# Patient Record
Sex: Female | Born: 1970 | Race: White | Hispanic: No | Marital: Married | Smoking: Never smoker
Health system: Southern US, Community
[De-identification: ages and names within clinical notes are randomized; demographics above are authoritative.]

## PROBLEM LIST (undated history)

## (undated) DIAGNOSIS — G43909 Migraine, unspecified, not intractable, without status migrainosus: Secondary | ICD-10-CM

## (undated) DIAGNOSIS — D259 Leiomyoma of uterus, unspecified: Secondary | ICD-10-CM

## (undated) DIAGNOSIS — N390 Urinary tract infection, site not specified: Secondary | ICD-10-CM

## (undated) DIAGNOSIS — K219 Gastro-esophageal reflux disease without esophagitis: Secondary | ICD-10-CM

## (undated) DIAGNOSIS — Z8701 Personal history of pneumonia (recurrent): Secondary | ICD-10-CM

## (undated) DIAGNOSIS — T7840XA Allergy, unspecified, initial encounter: Secondary | ICD-10-CM

## (undated) HISTORY — DX: Migraine, unspecified, not intractable, without status migrainosus: G43.909

## (undated) HISTORY — DX: Allergy, unspecified, initial encounter: T78.40XA

## (undated) HISTORY — DX: Personal history of pneumonia (recurrent): Z87.01

## (undated) HISTORY — DX: Gastro-esophageal reflux disease without esophagitis: K21.9

## (undated) HISTORY — PX: UPPER GASTROINTESTINAL ENDOSCOPY: SHX188

## (undated) HISTORY — PX: GALLBLADDER SURGERY: SHX652

## (undated) HISTORY — PX: WISDOM TOOTH EXTRACTION: SHX21

## (undated) HISTORY — DX: Urinary tract infection, site not specified: N39.0

## (undated) HISTORY — DX: Leiomyoma of uterus, unspecified: D25.9

## (undated) HISTORY — PX: OTHER SURGICAL HISTORY: SHX169

---

## 2000-12-31 ENCOUNTER — Inpatient Hospital Stay (HOSPITAL_COMMUNITY): Admission: AD | Admit: 2000-12-31 | Discharge: 2000-12-31 | Payer: Self-pay | Admitting: Obstetrics and Gynecology

## 2001-01-03 ENCOUNTER — Encounter: Payer: Self-pay | Admitting: Obstetrics and Gynecology

## 2001-01-03 ENCOUNTER — Ambulatory Visit (HOSPITAL_COMMUNITY): Admission: RE | Admit: 2001-01-03 | Discharge: 2001-01-03 | Payer: Self-pay | Admitting: Obstetrics and Gynecology

## 2001-01-04 ENCOUNTER — Observation Stay (HOSPITAL_COMMUNITY): Admission: AD | Admit: 2001-01-04 | Discharge: 2001-01-05 | Payer: Self-pay | Admitting: Obstetrics and Gynecology

## 2001-01-31 ENCOUNTER — Encounter: Payer: Self-pay | Admitting: Obstetrics and Gynecology

## 2001-01-31 ENCOUNTER — Ambulatory Visit (HOSPITAL_COMMUNITY): Admission: RE | Admit: 2001-01-31 | Discharge: 2001-01-31 | Payer: Self-pay | Admitting: Obstetrics and Gynecology

## 2001-04-13 ENCOUNTER — Encounter: Payer: Self-pay | Admitting: Obstetrics and Gynecology

## 2001-04-13 ENCOUNTER — Ambulatory Visit (HOSPITAL_COMMUNITY): Admission: RE | Admit: 2001-04-13 | Discharge: 2001-04-13 | Payer: Self-pay | Admitting: Obstetrics and Gynecology

## 2001-05-11 ENCOUNTER — Encounter: Payer: Self-pay | Admitting: Obstetrics and Gynecology

## 2001-05-11 ENCOUNTER — Ambulatory Visit (HOSPITAL_COMMUNITY): Admission: RE | Admit: 2001-05-11 | Discharge: 2001-05-11 | Payer: Self-pay | Admitting: Obstetrics and Gynecology

## 2001-07-07 ENCOUNTER — Inpatient Hospital Stay (HOSPITAL_COMMUNITY): Admission: AD | Admit: 2001-07-07 | Discharge: 2001-07-07 | Payer: Self-pay | Admitting: Obstetrics and Gynecology

## 2001-07-08 ENCOUNTER — Inpatient Hospital Stay (HOSPITAL_COMMUNITY): Admission: AD | Admit: 2001-07-08 | Discharge: 2001-07-10 | Payer: Self-pay | Admitting: Obstetrics and Gynecology

## 2001-07-13 ENCOUNTER — Inpatient Hospital Stay (HOSPITAL_COMMUNITY): Admission: AD | Admit: 2001-07-13 | Discharge: 2001-07-13 | Payer: Self-pay | Admitting: Obstetrics and Gynecology

## 2006-11-03 ENCOUNTER — Ambulatory Visit: Payer: Self-pay | Admitting: Family Medicine

## 2007-01-24 ENCOUNTER — Ambulatory Visit: Payer: Self-pay | Admitting: Internal Medicine

## 2007-01-24 DIAGNOSIS — R109 Unspecified abdominal pain: Secondary | ICD-10-CM | POA: Insufficient documentation

## 2009-10-06 ENCOUNTER — Telehealth: Payer: Self-pay | Admitting: Internal Medicine

## 2010-05-14 ENCOUNTER — Encounter: Admission: RE | Admit: 2010-05-14 | Discharge: 2010-05-14 | Payer: Self-pay | Admitting: Family Medicine

## 2010-05-29 ENCOUNTER — Ambulatory Visit: Payer: Self-pay | Admitting: Cardiology

## 2010-05-29 ENCOUNTER — Encounter: Payer: Self-pay | Admitting: Family Medicine

## 2010-06-05 ENCOUNTER — Ambulatory Visit: Payer: Self-pay | Admitting: Cardiology

## 2010-07-09 ENCOUNTER — Emergency Department (HOSPITAL_COMMUNITY): Admission: EM | Admit: 2010-07-09 | Discharge: 2010-07-09 | Payer: Self-pay | Admitting: Emergency Medicine

## 2010-08-11 ENCOUNTER — Encounter: Payer: Self-pay | Admitting: Family Medicine

## 2010-08-13 ENCOUNTER — Encounter: Payer: Self-pay | Admitting: Family Medicine

## 2010-08-14 ENCOUNTER — Encounter: Payer: Self-pay | Admitting: Family Medicine

## 2010-08-18 ENCOUNTER — Encounter: Payer: Self-pay | Admitting: Family Medicine

## 2010-09-01 ENCOUNTER — Ambulatory Visit (HOSPITAL_COMMUNITY)
Admission: RE | Admit: 2010-09-01 | Discharge: 2010-09-01 | Payer: Self-pay | Source: Home / Self Care | Attending: Gastroenterology | Admitting: Gastroenterology

## 2010-09-17 ENCOUNTER — Ambulatory Visit
Admission: RE | Admit: 2010-09-17 | Discharge: 2010-09-17 | Payer: Self-pay | Source: Home / Self Care | Attending: Family Medicine | Admitting: Family Medicine

## 2010-09-17 DIAGNOSIS — K219 Gastro-esophageal reflux disease without esophagitis: Secondary | ICD-10-CM | POA: Insufficient documentation

## 2010-09-17 DIAGNOSIS — G43909 Migraine, unspecified, not intractable, without status migrainosus: Secondary | ICD-10-CM | POA: Insufficient documentation

## 2010-09-17 DIAGNOSIS — R209 Unspecified disturbances of skin sensation: Secondary | ICD-10-CM | POA: Insufficient documentation

## 2010-09-26 ENCOUNTER — Encounter: Payer: Self-pay | Admitting: Gastroenterology

## 2010-09-28 ENCOUNTER — Other Ambulatory Visit: Payer: Self-pay | Admitting: Gastroenterology

## 2010-10-02 ENCOUNTER — Encounter
Admission: RE | Admit: 2010-10-02 | Discharge: 2010-10-02 | Payer: Self-pay | Source: Home / Self Care | Attending: Surgery | Admitting: Surgery

## 2010-10-06 ENCOUNTER — Other Ambulatory Visit: Payer: Self-pay | Admitting: Surgery

## 2010-10-06 NOTE — Progress Notes (Signed)
Summary: ? Stomach virus  Phone Note Call from Patient Call back at Home Phone [phone]   Caller: Spouse Call For: Kristen Salt MD Summary of Call: Spoke with patient's husband.  Patient has been nauseated, having diarrhea, body aches since yesterday.  No fever, no sore throat.  Taking Ondansetron for nausea.  He says she has not been eating but taking sips of Coco cola.  Advised him that she should probably drink clear fluids like ginger ale, 7Up, Sprite, or gatoraid.  Avoid dark soda's and fluids, spicy foods, greasy foods.  Advised to hydrate herself well even if she has no appetite, appetite will eventually come back as she starts to feel better.  Advised him of the [employer]. Try to get plenty of rest.  Advised that I would sent this note to the doctor but there is not much that can be done if she has a virus, it will have to run its course.   Initial call taken by: Linde Gillis CMA Duncan Dull),  October 06, 2009 12:11 PM  Follow-up for Phone Call        should just have appt if sig fever or abd pain, or not improving Follow-up by: Kristen Salt MD,  October 06, 2009 12:56 PM  Additional Follow-up for Phone Call Additional follow up Details #1::        pt will just go to urgent care if not better DeShannon Smith CMA [facility] Dull)  October 06, 2009 2:24 PM

## 2010-10-08 NOTE — Assessment & Plan Note (Signed)
Summary: BRAND NEW PT/TO EST/REFERRED BY DR BRACKBILL/CJR   Vital Signs:  Patient profile:   40 year old female Menstrual status:  irregular LMP:     08/07/2010 Height:      65.50 inches Weight:      139 pounds BMI:     22.86 Temp:     98.5 degrees F oral Pulse rate:   100 / minute Pulse rhythm:   regular Resp:     12 per minute BP sitting:   140 / 90  (left arm) Cuff size:   regular  Vitals Entered By: Sid Falcon LPN (September 17, 2010 1:53 PM)  History of Present Illness: Patient is seen to establish care. Past medical history reviewed. She has history of GERD, migraine headaches, and migratory paresthesias for which she has seen multiple specialists. She has seen neurologist in [place], gastroenterologist here in [place], and cardiologist locally. She apparently had multiple tests with MRI and some labs.  No old records for review at this time. Migraines are somewhat infrequent.  Recurrent right upper quadrant pain with reported normal ultrasound and HIDA scan relatively normal. Continues to see gastroenterologist. Consideration for cholecystectomy. No recent fever, vomiting, weight loss.  No alleviating facotrs.  Intermittent numbness and stinging sensation of the tongue. Not sure of B12 as mentioned previously. This symptom has been intermittent for several months  History of GERD. No dysphagia. Takes omeprazole regularly. Prior h pylori testing negative.  Family history significant for hyperlipidemia. Stroke in one grandparent.  Patient is married. One 51-year-old child. Nonsmoker. No alcohol use. Works in Primary school teacher.  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 Never Smoked Alcohol use-yes Regular exercise-no  Risk Factors: Exercise: no (09/17/2010)  Risk Factors: Smoking Status: never (09/17/2010)  Past Medical History: chicken pox migraines GERD  Past Surgical History: none PMH-FH-SH reviewed for relevance  Family History: Family History of Arthritis, maternal grandfather Aunt, colon ca Father, elevated cholesterol paternal grandfather, stroke Aunt, cereberal hemerage 14 aunt, diabetes Cousin, diabetes  Social History: 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 Never Smoked Alcohol use-yes Regular exercise-no Occupation:  employed Smoking Status:  never Does Patient Exercise:  no  Review of Systems  The patient denies anorexia, fever, weight loss, weight gain, vision loss, decreased hearing, hoarseness, chest pain, syncope, dyspnea on exertion, peripheral edema, prolonged cough, headaches, hemoptysis, melena, hematochezia, severe indigestion/heartburn, hematuria, incontinence, muscle weakness, suspicious skin lesions, transient blindness, difficulty walking, depression, unusual weight change, abnormal bleeding, enlarged lymph nodes, and breast masses.    Physical Exam  General:  Well-developed,well-nourished,in no acute distress; alert,appropriate and cooperative throughout examination Head:  normocephalic and atraumatic.   Eyes:  pupils equal, pupils round, and pupils reactive to light.   Ears:  External ear exam shows no significant lesions or deformities.  Otoscopic examination reveals clear canals, tympanic membranes are intact bilaterally without bulging, retraction, inflammation or discharge. Hearing is grossly normal bilaterally. Mouth:  Oral mucosa and oropharynx without lesions or exudates.  Teeth in good repair. Neck:  No  deformities, masses, or tenderness noted. Lungs:  Normal respiratory effort, chest expands symmetrically. Lungs are clear  to auscultation, no crackles or wheezes. Heart:  Normal rate and regular rhythm. S1 and S2 normal without gallop, murmur, click, rub or other extra sounds. Abdomen:  soft and non-tender.   Extremities:  no edema. Neurologic:  alert & oriented X3, cranial nerves II-XII intact, strength normal in all extremities, sensation intact to light touch, gait normal, and DTRs symmetrical and normal.   Skin:  no rashes and no suspicious lesions.   Cervical Nodes:  couple small anterior cervical nodes bilaterally no supraclavicular adenopathy Psych:  normally interactive, good eye contact, and slightly anxious.     Impression & Recommendations:  Problem # 1:  GERD (ICD-530.81)  Her updated medication list for this problem includes:    Omeprazole 40 Mg Cpdr (Omeprazole) ..... Once daily  Problem # 2:  ABDOMINAL PAIN (ICD-789.00) recurrent RUQ pain with extensive workup.  ?acalculous cholecystitis  Problem # 3:  PARESTHESIA (ICD-782.0) Migratory paresthesias extrremities and more recently tongue.  Check old records. check B12 if not assessed.  Problem # 4:  MIGRAINE HEADACHE (ICD-346.90)  Complete Medication List: 1)  Omeprazole 40 Mg Cpdr (Omeprazole) .... Once daily  Patient Instructions: 1)  obtain old records from Dr. Patty Sermons and from neurologist   Orders Added: 1)  New Patient Level III [99203]    Preventive Care Screening  Pap Smear:    Date:  09/06/2009    Results:  normal

## 2010-10-08 NOTE — Letter (Signed)
Summary: Records from [facility] 2006 - 2011  Records from [facility] 2006 - 2011   Imported By: Maryln Gottron 09/22/2010 14:01:04  _____________________________________________________________________  External Attachment:    Type:   Image     Comment:   External Document

## 2010-10-14 NOTE — Letter (Signed)
Summary: [facility]  [facility]   Imported By: Maryln Gottron 10/07/2010 10:41:05  _____________________________________________________________________  External Attachment:    Type:   Image     Comment:   External Document

## 2010-10-14 NOTE — Letter (Signed)
Summary: [facility]  [facility]   Imported By: Maryln Gottron 10/07/2010 10:42:21  _____________________________________________________________________  External Attachment:    Type:   Image     Comment:   External Document

## 2010-11-17 LAB — HEPATIC FUNCTION PANEL
ALT: 19 U/L (ref 0–35)
Indirect Bilirubin: 0.8 mg/dL (ref 0.3–0.9)
Total Protein: 7.2 g/dL (ref 6.0–8.3)

## 2010-11-17 LAB — CBC
HCT: 40.4 % (ref 36.0–46.0)
Hemoglobin: 13.8 g/dL (ref 12.0–15.0)
RBC: 4.22 MIL/uL (ref 3.87–5.11)
WBC: 6.5 10*3/uL (ref 4.0–10.5)

## 2010-11-17 LAB — URINALYSIS, ROUTINE W REFLEX MICROSCOPIC
Glucose, UA: NEGATIVE mg/dL
Protein, ur: NEGATIVE mg/dL
pH: 6 (ref 5.0–8.0)

## 2010-11-17 LAB — DIFFERENTIAL
Lymphocytes Relative: 29 % (ref 12–46)
Lymphs Abs: 1.9 10*3/uL (ref 0.7–4.0)
Monocytes Absolute: 0.4 10*3/uL (ref 0.1–1.0)
Monocytes Relative: 6 % (ref 3–12)
Neutro Abs: 4.2 10*3/uL (ref 1.7–7.7)
Neutrophils Relative %: 64 % (ref 43–77)

## 2010-11-17 LAB — BASIC METABOLIC PANEL
Calcium: 9.5 mg/dL (ref 8.4–10.5)
GFR calc Af Amer: >60 mL/min (ref >60)
GFR calc non Af Amer: >60 mL/min (ref >60)
Glucose, Bld: 99 mg/dL (ref 70–99)
Potassium: 3.6 mEq/L (ref 3.5–5.1)
Sodium: 141 mEq/L (ref 135–145)

## 2010-11-17 LAB — URINE MICROSCOPIC-ADD ON

## 2011-01-04 IMAGING — CT CT ABD-PELV W/ CM
1 of 3 series · 14 of 32 positions shown, 19 images · IV contrast (agent unspecified)
Comparison: None.

CLINICAL DATA: Right sided abdominal pain.  Nausea.  Hematuria.

CT ABDOMEN AND PELVIS WITH CONTRAST
TECHNIQUE: Multidetector CT imaging of the abdomen and pelvis was
performed following the standard protocol during bolus
administration of intravenous contrast.
Contrast: 100 ml Kmnipaque-2SS

[Series 2: rtn ap with st · axial · 0.70mm/px · z∈[-566,-171]mm · 14 of 89 slices shown, 19 images]
[im 5/89  soft-tissue]
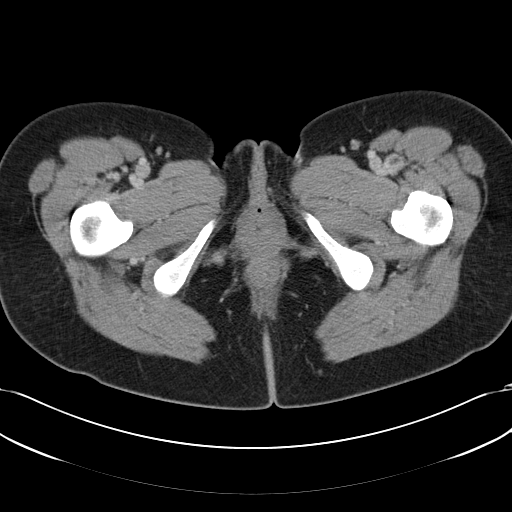
[im 5/89  bone]
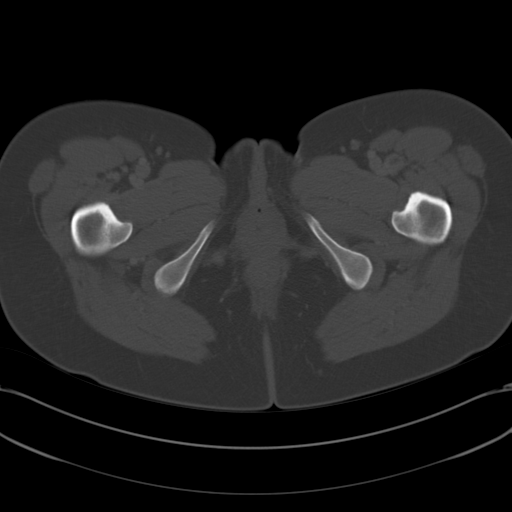
[im 14/89  soft-tissue]
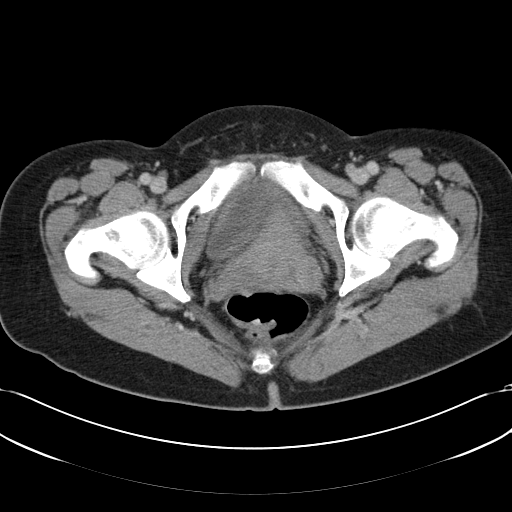
[im 18/89  soft-tissue]
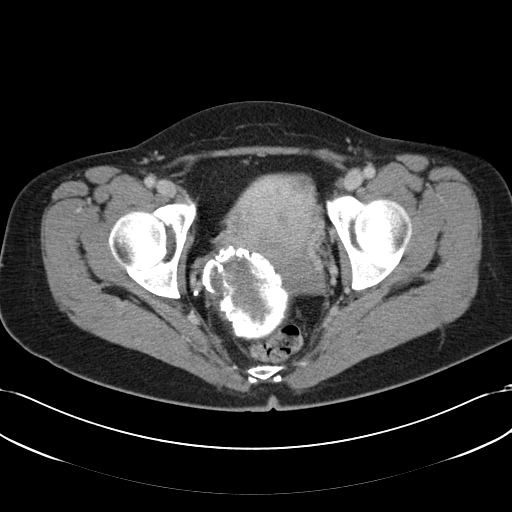
[im 27/89  soft-tissue]
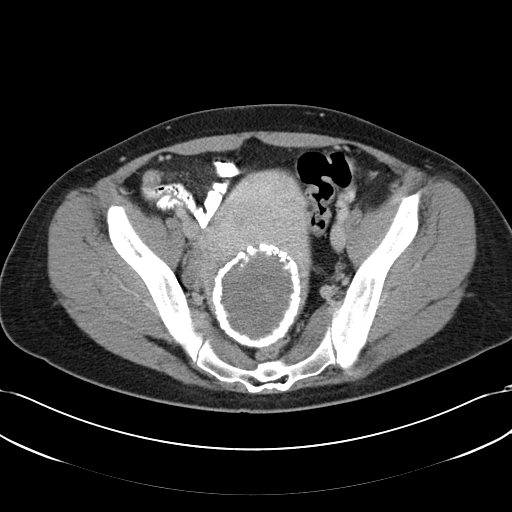
[im 31/89  soft-tissue]
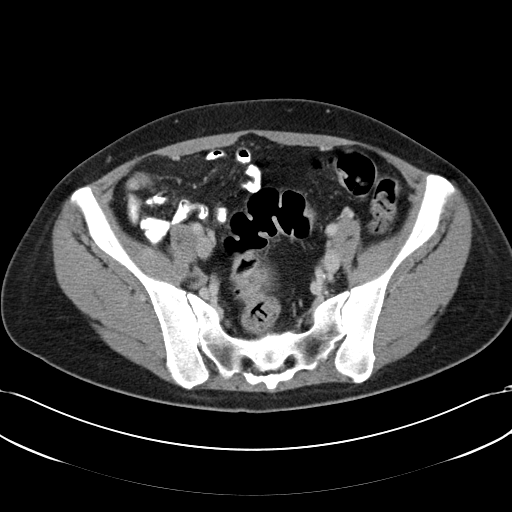
[im 40/89  soft-tissue]
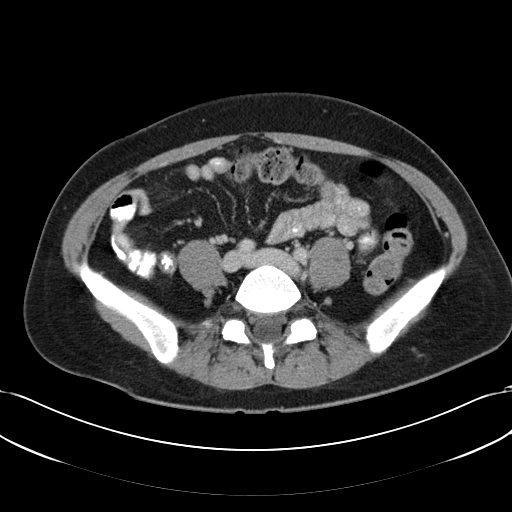
[im 45/89  soft-tissue]
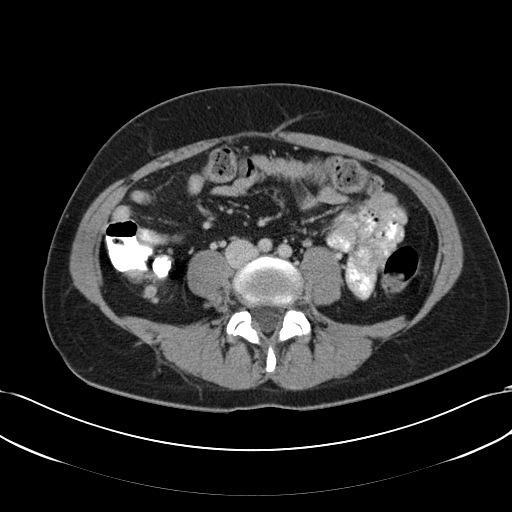
[im 49/89  soft-tissue]
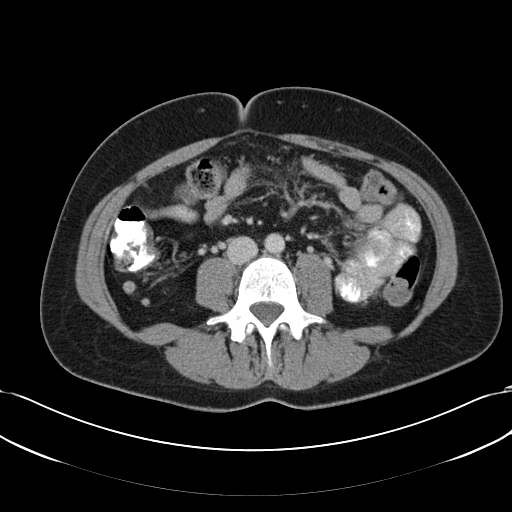
[im 58/89  soft-tissue]
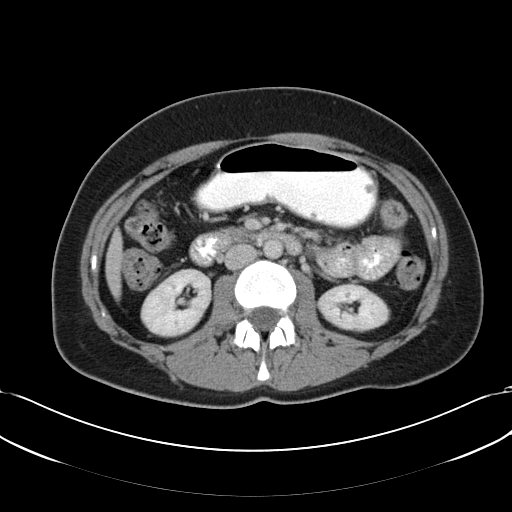
[im 58/89  bone]
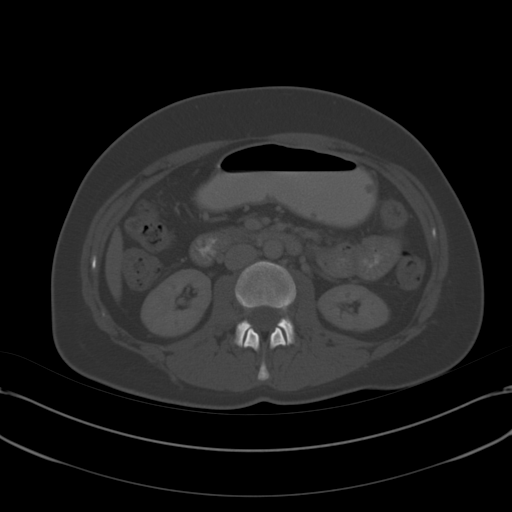
[im 62/89  soft-tissue]
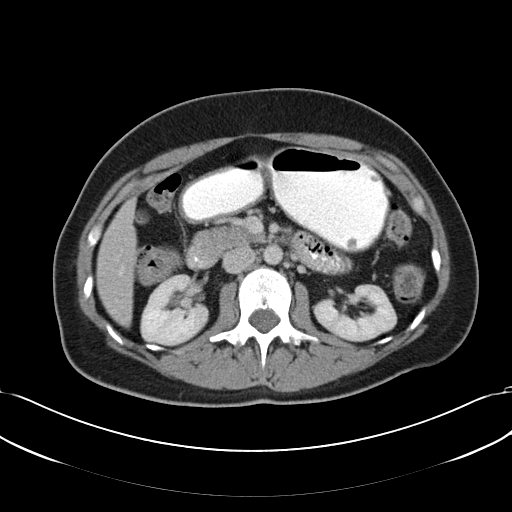
[im 71/89  soft-tissue]
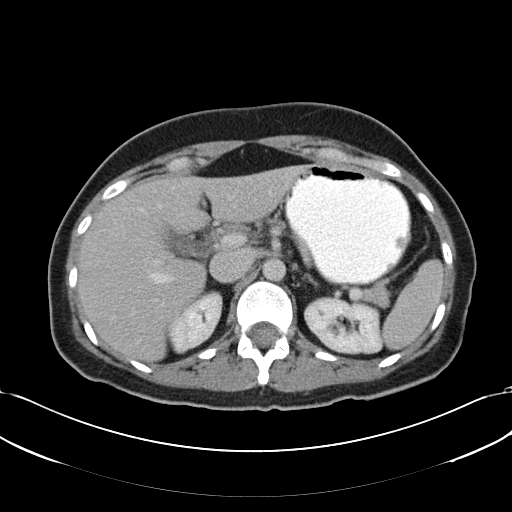
[im 71/89  lung]
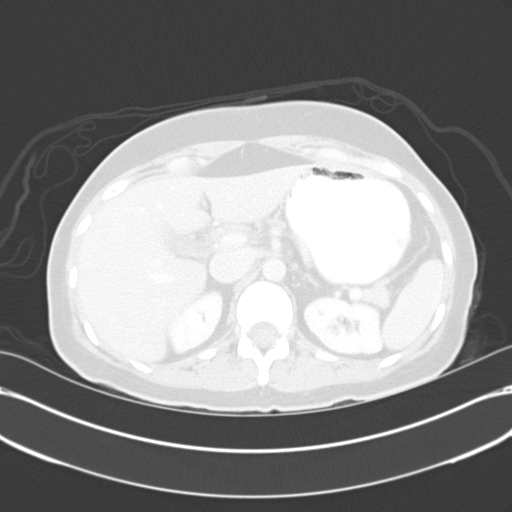
[im 75/89  soft-tissue]
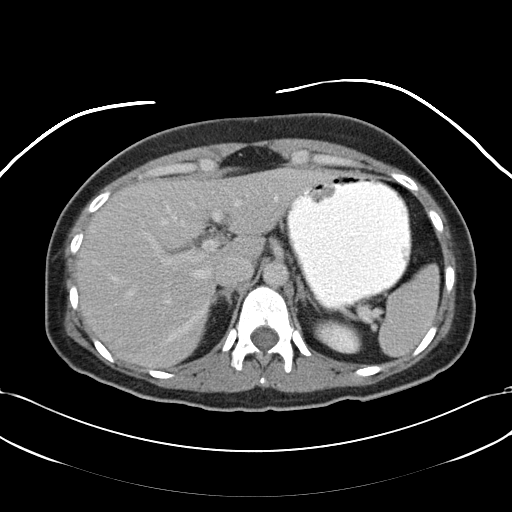
[im 75/89  lung]
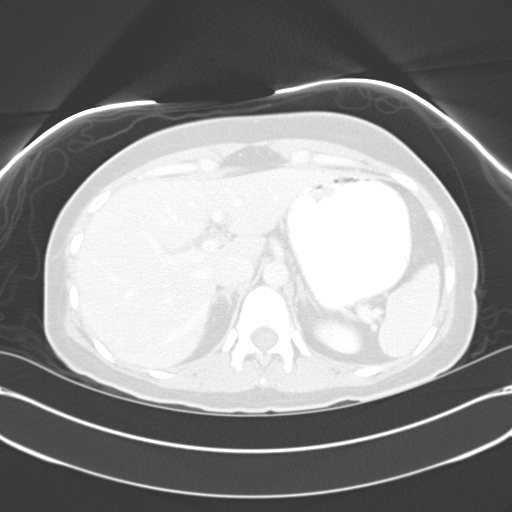
[im 80/89  lung]
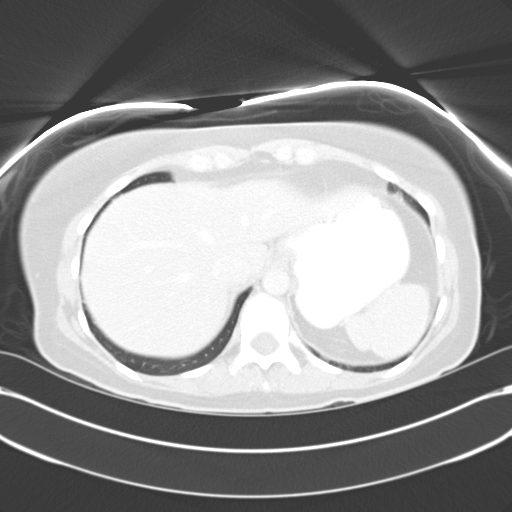
[im 84/89  soft-tissue]
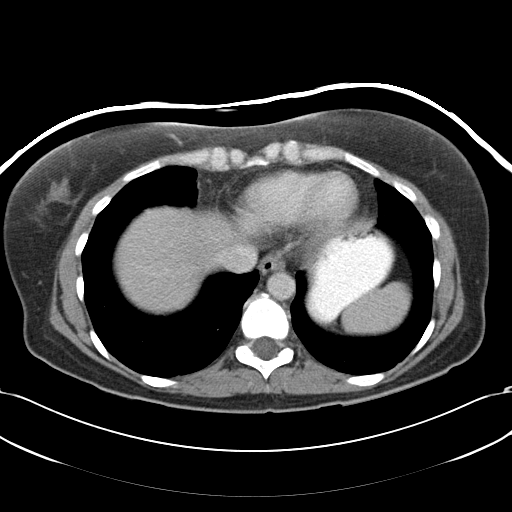
[im 84/89  lung]
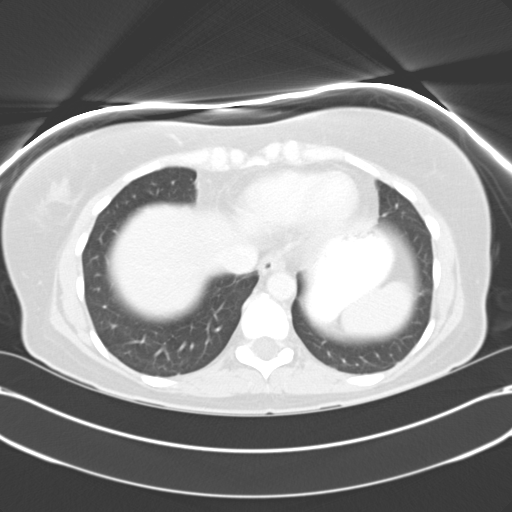

[14 of 32 positions shown; findings below may reference images not displayed]

FINDINGS: Lung bases show scattered subpleural atelectasis.  Heart
size normal.  No pericardial or pleural effusion.

Liver, gallbladder, adrenal glands, kidneys, spleen, pancreas
unremarkable.  There are numerous polypoid soft tissue lesions
along the dependent and nondependent walls of the gastric body,
with antral sparing.  Small bowel, appendix and colon are
unremarkable.

A rim calcified mass in the uterus measures 8.5 x 7.1 cm.  There
are smaller low attenuation lesions in the uterus as well.  No
pathologically enlarged lymph nodes.  No worrisome lytic or
sclerotic lesions.
IMPRESSION: 1.  No acute findings in the abdomen or pelvis.
2.  Suspect gastric polyps.  Upper endoscopy may be helpful in
further evaluation, as clinically indicated.
3.  Probable uterine fibroids, one of which is rather large.

## 2011-01-05 ENCOUNTER — Encounter: Payer: Self-pay | Admitting: Family Medicine

## 2011-01-22 NOTE — Discharge Summary (Signed)
[facility]  Patient:    RESA, RINKS Visit Number: [id number] MRN: [id number]          Service Type: GYN Location: [facility] Attending Physician:  Oliver Pila Dictated by:   Zenaida Niece, M.D. Admit Date:  07/13/2001 Discharge Date: 07/13/2001                             Discharge Summary  ADMISSION DIAGNOSES:        1. Intrauterine pregnancy at 40 weeks.                               2. Leiomyomatous uterus.  DISCHARGE DIAGNOSES:          1. Intrauterine pregnancy at 40 weeks.                               2. Leiomyomatous uterus.                               3. Postpartum urinary retention.  PROCEDURES:                   Spontaneous vaginal delivery.  COMPLICATIONS:                Postpartum urinary retention.  CONSULTATIONS:                None.  HISTORY AND PHYSICAL:         This is a 40 year old white female gravida 1, para 0 with an EGA of [redacted] weeks by a 10-week ultrasound with a due date of July 02, 2001, who presents with a complaint of ruptured membranes with clear fluid at approximately 0145 on July 08, 2001, with a few contractions, no bleeding and good fetal movement.  Prenatal care complicated by a possible second trimester kidney stone.  She was also admitted at 14 weeks for pain from a degenerating fibroid.  She has had gastroesophageal reflux requiring Prevacid due to the fact that Zantac did not provide any help.  She has had anemia treated with iron, as well.  PRENATAL LABS:                Blood type is A-positive with a negative antibody screen.  RPR nonreactive.  Rubella immune.  Hepatitis B surface antigen negative.  HIV negative.  Gonorrhea and chlamydia negative.  One-hour Glucola 139 and group B strep is negative.  GYNECOLOGICAL HISTORY:        No prior Pap smears or GYN exams.  PAST MEDICAL HISTORY:         Pneumonia at age 33 and a history of migraine headaches.  PAST SURGICAL HISTORY:         Negative.  ALLERGIES:                    CODEINE.  MEDICATIONS:                  Iron sulfate and Prevacid 15 mg q.d.  PHYSICAL EXAM:  GENERAL:                      She is afebrile with stable vital signs.  Fetal heart tracing reactive with irregular contractions, on Pitocin.  ABDOMEN:                      Gravid, nontender, with an estimated fetal weight of 7-1/2 pounds.  VAGINAL EXAM:                 In maternity admissions, reveals positive nitrazine, positive fern and she was 1/50/-2 with a vertex presentation.  HOSPITAL COURSE:              Patient was admitted with ruptured membranes with irregular contractions and started on Pitocin for augmentation.  She received an epidural once she entered active labor and continued to progress. On the evening of July 08, 2001, she did progress to complete and pushed well.  She brought the vertex to a +4 station and was exhausted.  She was given a perineal dose of her epidural and the risks of assisted deliveries were discussed.  Fetal heart tracing at that time had a normal baseline, but with moderate to severe variable decelerations with contractions.  The patient wished to avoid an assisted delivery.  With fundal pressure and pushing, the vertex to +5, a second-degree midline episiotomy was then made.  She then had an SVD of viable female infant with Apgars of 1 and 8 at one and five minutes that weighed 6 pounds 14 ounces.  The baby initially made no respiratory effort and bivalve masking was performed and the NICU team was called. The placenta delivered spontaneously and was intact and an arterial cord pH was 7.01.  She had a third-degree extension of her episiotomy and the external anal sphincter was repaired with 2-0 Vicryl and the remainder of her episiotomy and a right vaginal fornix and left perineal extensions were repaired with 2-0 Vicryl.  Estimated blood loss was 600 cc.  Postpartum, she did very well, remained afebrile.   She did have difficulty voiding and had a Foley catheter placed on postpartum day #1 due to inability to void. Predelivery hemoglobin 11.2, post-delivery is 9.0.  On the morning of postpartum day #2, she remained stable.  She continued to be afebrile, but had her Foley catheter still in at that time.  The Foley catheter was removed and later that day, she was still unable to void, so the Foley catheter was replaced.  It was connected to a leg bag and she was discharged home.  CONDITION ON DISCHARGE:       Stable.  DISPOSITION:                  Discharged to home.  DISCHARGE INSTRUCTIONS:  DIET:                         Regular diet.  ACTIVITY:                     Pro-progress.  FOLLOW-UP:                    In approximately three days to see how she can do with her Foley catheter removed.  MEDICATIONS:                  Percocet p.r.n. pain, over-the-counter Colace b.i.d. and Macrobid one p.o. q.d. while she has her Foley catheter in.  She was also given our discharge pamphlet. Dictated by:   Zenaida Niece, M.D. Attending Physician:  Oliver Pila DD:  07/30/01 TD:  07/30/01 Job: [id number]

## 2011-05-25 ENCOUNTER — Ambulatory Visit (INDEPENDENT_AMBULATORY_CARE_PROVIDER_SITE_OTHER): Payer: BC Managed Care – PPO | Admitting: Family Medicine

## 2011-05-25 DIAGNOSIS — R109 Unspecified abdominal pain: Secondary | ICD-10-CM

## 2011-05-25 DIAGNOSIS — M549 Dorsalgia, unspecified: Secondary | ICD-10-CM

## 2011-05-25 LAB — POCT URINALYSIS DIPSTICK
Bilirubin, UA: NEGATIVE
Blood, UA: NEGATIVE
Glucose, UA: NEGATIVE
Spec Grav, UA: 1.005
Urobilinogen, UA: 0.2

## 2011-06-21 ENCOUNTER — Encounter: Payer: Self-pay | Admitting: Family Medicine

## 2011-06-21 ENCOUNTER — Ambulatory Visit (INDEPENDENT_AMBULATORY_CARE_PROVIDER_SITE_OTHER): Payer: BC Managed Care – PPO | Admitting: Family Medicine

## 2011-06-21 VITALS — BP 112/78 | Temp 98.5°F | Wt 149.0 lb

## 2011-06-21 DIAGNOSIS — L309 Dermatitis, unspecified: Secondary | ICD-10-CM

## 2011-06-21 DIAGNOSIS — L259 Unspecified contact dermatitis, unspecified cause: Secondary | ICD-10-CM

## 2011-06-21 DIAGNOSIS — K029 Dental caries, unspecified: Secondary | ICD-10-CM

## 2011-06-21 MED ORDER — PENICILLIN V POTASSIUM 500 MG PO TABS: 500.0000 mg | ORAL_TABLET | Freq: Three times a day (TID) | ORAL | Status: AC

## 2011-06-21 MED ORDER — MOMETASONE FUROATE 0.1 % EX SOLN: Freq: Every day | CUTANEOUS | Status: AC | PRN

## 2011-06-21 NOTE — Patient Instructions (Signed)
Schedule follow up with dentist soon.

## 2011-06-21 NOTE — Progress Notes (Signed)
  Subjective:    Patient ID: Kristen Schwartz, female    DOB: 09-Nov-1970, 40 y.o.   MRN: [id number]  HPI Left ear pain. Pain is somewhat poorly localized. She has noticed this over the past few days. Similar episode back in July and was placed on antibiotic and eventually improved. She has wisdom tooth left lower gum which has caused her problems in the past. She denies any TMJ pain. No sore throat. No fever or chills.   Review of Systems  Constitutional: Negative for fever and chills.  HENT: Negative for sore throat.   Hematological: Positive for adenopathy.       Objective:   Physical Exam  Constitutional: She appears well-developed and well-nourished. No distress.  HENT:  Mouth/Throat: Oropharynx is clear and moist.       Eardrums appear normal  Neck: Neck supple.       Slightly tender left anterior cervical node. No posterior cervical adenopathy  Cardiovascular: Normal rate and regular rhythm.   Pulmonary/Chest: Effort normal and breath sounds normal. No respiratory distress. She has no wheezes. She has no rales.  Skin:       Eczematous rash both external canals          Assessment & Plan:  #1 dental decay left posterior wisdom tooth with possible early dental abscess. Penicillin V 500 mg 3 times a day for 10 days and schedule with oral surgeon soon #2 eczema ear canals. Elocon lotion once daily as needed

## 2011-06-25 ENCOUNTER — Telehealth: Payer: Self-pay | Admitting: *Deleted

## 2011-06-25 NOTE — Telephone Encounter (Signed)
Pt is complaining of neck and jaw pain, and is wondering if the Penicillin could be causing this.

## 2011-06-25 NOTE — Telephone Encounter (Signed)
No.  She has dental decay and this is very likely causing some of her symptoms. She needs to see dentist ASAP and we discussed this with pt.

## 2011-06-25 NOTE — Telephone Encounter (Signed)
Pt given Dr. Lucie Leather recommendations.

## 2011-07-09 NOTE — Progress Notes (Signed)
System Downtime Recovery The EMR experienced a system downtime.  This downtime occurred on 05-25-2011. During this downtime paper charting was completed by the provider.  The visit was documented on paper during the downtime and will be scanned into [facility], billing was completed by the [facility] Billing Department .  The visit is being closed on behalf of the provider. 

## 2011-09-14 ENCOUNTER — Telehealth: Payer: Self-pay | Admitting: *Deleted

## 2011-09-14 NOTE — Telephone Encounter (Signed)
Notified pt. 

## 2011-09-14 NOTE — Telephone Encounter (Signed)
Pt has a white, small lesion at the back of her throat with no symptoms.  There are people in her house that are sick with viral illnesses.  Wants to know if she needs to come in since she is having no pain?

## 2011-09-14 NOTE — Telephone Encounter (Signed)
Would observe if no sore throat and no fever.

## 2012-01-30 ENCOUNTER — Encounter: Payer: Self-pay | Admitting: *Deleted

## 2012-06-08 ENCOUNTER — Telehealth: Payer: Self-pay | Admitting: Family Medicine

## 2012-06-08 NOTE — Telephone Encounter (Signed)
[place] calling[place] cervical lymphadenopacthy. Was started on Keflex 500mg  tid x 7 days. She started the Keflex at lunch on Tuesday 10/1. Has swelling from her ear, face and neck on the left side.  Has slight pain.    She thought she should see some improvement and feel some better by today.   She is able to open and close her mouth normally.  Her face is sore.  No fever.  Denies any chest pain or shortness of breath.   [place] from the beach on [place]5.  She will continue with the Keflex.   Needs to be seen in the office on Monday, 10/7 and would like an afternoon appointment.  PLEASE CALL TO SCHEDULE APPT for 10/7.

## 2012-06-08 NOTE — Telephone Encounter (Signed)
Appt sched for 10/7 - pt aware.

## 2012-06-12 ENCOUNTER — Ambulatory Visit (INDEPENDENT_AMBULATORY_CARE_PROVIDER_SITE_OTHER): Payer: BC Managed Care – PPO | Admitting: Family Medicine

## 2012-06-12 ENCOUNTER — Encounter: Payer: Self-pay | Admitting: Family Medicine

## 2012-06-12 VITALS — BP 100/70 | Temp 98.3°F | Wt 159.0 lb

## 2012-06-12 DIAGNOSIS — B029 Zoster without complications: Secondary | ICD-10-CM

## 2012-06-12 NOTE — Progress Notes (Signed)
  Subjective:    Patient ID: Kristen Schwartz, female    DOB: 1971/06/01, 41 y.o.   MRN: [id number]  HPI  Less than 2 weeks ago patient had some left face "tingling". She noticed some lymphadenopathy left anterior cervical triangle region. Was seen at urgent care center and apparently placed on Keflex. Subsequently several days later broke out in a rash along with some tingling sensation trigeminal nerve distribution. She's had 3 separate clusters of vesicles left side of face which are now crusted. No fevers or chills. Lymphadenopathy is slowly improving. Minimal pain. No ear symptoms. No visual changes. She has had prior chickenpox   Review of Systems  Constitutional: Negative for fever and chills.  Skin: Positive for rash.  Neurological: Negative for headaches.  Hematological: Positive for adenopathy.       Objective:   Physical Exam  Constitutional: She appears well-developed and well-nourished.  Neck: Neck supple.       Minimal left anterior cervical triangle lymphadenopathy. Minimally tender.  Cardiovascular: Normal rate and regular rhythm.   Skin:       Patient has a few crusted lesions including left upper mandible region, left lower mandible region, and left lower lip region. No pustules          Assessment & Plan:  Resolving shingles left side of face. We explained her lymphadenopathy is very likely reactive and secondary to this. She is healing well with no signs of secondary infection. No indication for further antibiotics at this time. She is not requiring pain meds.

## 2012-06-12 NOTE — Patient Instructions (Addendum)

## 2012-07-20 ENCOUNTER — Encounter: Payer: Self-pay | Admitting: Cardiology

## 2012-10-27 ENCOUNTER — Encounter: Payer: Self-pay | Admitting: Cardiology

## 2012-11-27 ENCOUNTER — Telehealth: Payer: Self-pay | Admitting: Family Medicine

## 2012-11-27 NOTE — Telephone Encounter (Signed)
Attempted to return call to patient but call went to voice mail.  An identified voice mail left for caller.

## 2012-12-04 ENCOUNTER — Ambulatory Visit: Payer: BC Managed Care – PPO | Admitting: Family Medicine

## 2012-12-11 ENCOUNTER — Encounter: Payer: Self-pay | Admitting: Family Medicine

## 2012-12-11 ENCOUNTER — Ambulatory Visit (INDEPENDENT_AMBULATORY_CARE_PROVIDER_SITE_OTHER): Payer: BC Managed Care – PPO | Admitting: Family Medicine

## 2012-12-11 VITALS — BP 122/72 | Temp 98.6°F | Wt 162.0 lb

## 2012-12-11 DIAGNOSIS — H6120 Impacted cerumen, unspecified ear: Secondary | ICD-10-CM

## 2012-12-11 DIAGNOSIS — H6123 Impacted cerumen, bilateral: Secondary | ICD-10-CM

## 2012-12-11 DIAGNOSIS — R42 Dizziness and giddiness: Secondary | ICD-10-CM

## 2012-12-11 LAB — CBC WITH DIFFERENTIAL/PLATELET
Eosinophils Relative: 2.4 % (ref 0.0–5.0)
HCT: 38.4 % (ref 36.0–46.0)
Hemoglobin: 13.1 g/dL (ref 12.0–15.0)
Lymphs Abs: 2.3 10*3/uL (ref 0.7–4.0)
MCV: 94.2 fl (ref 78.0–100.0)
Monocytes Absolute: 0.4 10*3/uL (ref 0.1–1.0)
Monocytes Relative: 5.2 % (ref 3.0–12.0)
Neutro Abs: 4 10*3/uL (ref 1.4–7.7)
Platelets: 304 10*3/uL (ref 150.0–400.0)
RDW: 13.7 % (ref 11.5–14.6)
WBC: 6.9 10*3/uL (ref 4.5–10.5)

## 2012-12-11 LAB — BASIC METABOLIC PANEL
BUN: 14 mg/dL (ref 6–23)
Chloride: 103 mEq/L (ref 96–112)
GFR: 79.29 mL/min (ref >60.00)
Glucose, Bld: 100 mg/dL — ABNORMAL HIGH (ref 70–99)
Potassium: 4 mEq/L (ref 3.5–5.1)
Sodium: 139 mEq/L (ref 135–145)

## 2012-12-11 NOTE — Patient Instructions (Addendum)
Drink plenty of fluids Change positions slowly. Followup promptly if you have any syncope (passing out) or any worsening dizziness We will call you regarding lab work

## 2012-12-11 NOTE — Progress Notes (Signed)
  Subjective:    Patient ID: Kristen Schwartz, female    DOB: August 26, 1971, 42 y.o.   MRN: [id number]  HPI Feels "light headed" for several months.  Not always related to position change No syncope.  No consistent palpitations. Home BP 106/62 with one episode. No chest pain.  No vertigo.  No focal weakness. Patient denies any focal neurologic symptoms. She takes no regular medications.  Bilateral ear fullness. Prior history of cerumen impactions previously  Past Medical History  Diagnosis Date  . H/O: pneumonia   . Uterine fibroid   . Recurrent UTI    Past Surgical History  Procedure Laterality Date  . None      reports that she has never smoked. She does not have any smokeless tobacco history on file. She reports that she does not drink alcohol. Her drug history is not on file. family history includes Mitral valve prolapse in her mother. Allergies  Allergen Reactions  . Codeine Sulfate     REACTION: gi upset      Review of Systems  Constitutional: Negative for fever, chills and fatigue.  Respiratory: Negative for shortness of breath.   Cardiovascular: Negative for chest pain.  Skin: Negative for rash.  Neurological: Positive for dizziness. Negative for tremors, seizures, syncope, facial asymmetry, speech difficulty, weakness and headaches.  Psychiatric/Behavioral: Negative for confusion and sleep disturbance.       Objective:   Physical Exam  Constitutional: She is oriented to person, place, and time. She appears well-developed and well-nourished.  HENT:  Bilateral cerumen impactions  Eyes: Pupils are equal, round, and reactive to light.  Neck: Neck supple. No thyromegaly present.  Cardiovascular: Normal rate and regular rhythm.   Pulmonary/Chest: Effort normal and breath sounds normal. No respiratory distress. She has no wheezes. She has no rales.  Musculoskeletal: She exhibits no edema.  Neurological: She is alert and oriented to person, place, and time. No cranial  nerve deficit. Coordination normal.          Assessment & Plan:  #1 nonspecific dizziness. She describes this as lightheadedness. Nonfocal neurologic exam. She has blood pressure sitting 130/80 and standing 122/72. She does not appear anemic clinically. Check some basic labs. Increase hydration. Consider event monitor if symptoms persist or worsen #2 bilateral cerumen impactions. Irrigation

## 2012-12-13 NOTE — Progress Notes (Signed)
Quick Note:  Pt informed ______ 

## 2014-08-20 ENCOUNTER — Ambulatory Visit (INDEPENDENT_AMBULATORY_CARE_PROVIDER_SITE_OTHER): Payer: BC Managed Care – PPO | Admitting: Nurse Practitioner

## 2014-08-20 ENCOUNTER — Telehealth: Payer: Self-pay | Admitting: Family Medicine

## 2014-08-20 ENCOUNTER — Encounter: Payer: Self-pay | Admitting: Nurse Practitioner

## 2014-08-20 VITALS — BP 124/83 | HR 95 | Temp 98.4°F | Ht 66.0 in | Wt 161.0 lb

## 2014-08-20 DIAGNOSIS — B009 Herpesviral infection, unspecified: Secondary | ICD-10-CM

## 2014-08-20 MED ORDER — VALACYCLOVIR HCL 1 G PO TABS: 2000.0000 mg | ORAL_TABLET | Freq: Two times a day (BID) | ORAL | Status: DC

## 2014-08-20 NOTE — Patient Instructions (Signed)
I think you are having a herpes simplex outbreak.  It will flare with stress & sun exposure.  Please start valtrex as directed.  Salt water gargles twice daily (1/4 tsp salt mixed w/1/4 cup warm water) until sore resolved.  Nice to see you!  Herpes Simplex Herpes simplex is generally classified as Type 1 or Type 2. Type 1 is generally the type that is responsible for cold sores. Type 2 is generally associated with sexually transmitted diseases. We now know that most of the thoughts on these viruses are inaccurate. We find that HSV1 is also present genitally and HSV2 can be present orally, but this will vary in different locations of the world. Herpes simplex is usually detected by doing a culture. Blood tests are also available for this virus; however, the accuracy is often not as good.  PREPARATION FOR TEST No preparation or fasting is necessary. NORMAL FINDINGS  No virus present  No HSV antigens or antibodies present Ranges for normal findings may vary among different laboratories and hospitals. You should always check with your doctor after having lab work or other tests done to discuss the meaning of your test results and whether your values are considered within normal limits. MEANING OF TEST  Your caregiver will go over the test results with you and discuss the importance and meaning of your results, as well as treatment options and the need for additional tests if necessary. OBTAINING THE TEST RESULTS  It is your responsibility to obtain your test results. Ask the lab or department performing the test when and how you will get your results. Document Released: 09/25/2004 Document Revised: 11/15/2011 Document Reviewed: 08/03/2008 [facility] Patient Information 2015 [place], [place]. This information is not intended to replace advice given to you by your health care provider. Make sure you discuss any questions you have with your health care provider.

## 2014-08-20 NOTE — Progress Notes (Signed)
Subjective:     Kristen Schwartz is a 43 y.o. female presents w/c/o sore on tongue since yesterday, swollen cervical nodes for 3 days, mild HA, scratchy throat, low grade fever last night. Pt states she has had self-limiting episodes of LAD for 4 to 5 years. She states she has had w/u in past w/her PCP. It isn't typical to have sores in mouth, but occasionally gets "fever blisters on lips. She had shingles on L side of face about 2 yrs ago. She denies cough, nasal congestion, ear pain.   The following portions of the patient's history were reviewed and updated as appropriate: allergies, current medications, past medical history, past social history, past surgical history and problem list.  Review of Systems Constitutional: positive for fevers, negative for fatigue Ears, nose, mouth, throat, and face: positive for earaches and scratchy throat, negative for nasal congestion Respiratory: negative for cough Gastrointestinal: negative for diarrhea, nausea and anorexia Integument/breast: negative for rash Neurological: positive for headaches, negative for dizziness   Mouth: sore on L side of tongue Objective:    BP 124/83 mmHg  Pulse 95  Temp(Src) 98.4 F (36.9 C) (Temporal)  Ht 5\' 6"  (1.676 m)  Wt 161 lb (73.029 kg)  BMI 26.00 kg/m2  SpO2 99% BP 124/83 mmHg  Pulse 95  Temp(Src) 98.4 F (36.9 C) (Temporal)  Ht 5\' 6"  (1.676 m)  Wt 161 lb (73.029 kg)  BMI 26.00 kg/m2  SpO2 99% General appearance: alert, cooperative, appears stated age and no distress Head: Normocephalic, without obvious abnormality, atraumatic Eyes: negative findings: lids and lashes normal and conjunctivae and sclerae normal Ears: dry cerumen in both canals. unable to visualize TM Throat: normal findings: lips normal without lesions and soft palate, uvula, and tonsils normal and abnormal findings: aphthous ulceration Lungs: clear to auscultation bilaterally Heart: regular rate and rhythm, S1, S2 normal, no murmur, click,  rub or gallop Skin: Skin color, texture, turgor normal. No rashes or lesions Lymph nodes: bilat enlarged tonsillar nodes L>R, no cervical or [place] LAD    Assessment:Plan   1. HSV infection DD: hand foot mouth - valACYclovir (VALTREX) 1000 MG tablet; Take 2 tablets (2,000 mg total) by mouth every 12 (twelve) hours.  Dispense: 4 tablet; Refill: 1  See patient instructions for complete plan. F/u PRN

## 2014-08-20 NOTE — Progress Notes (Signed)
Pre visit review using our clinic review tool, if applicable. No additional management support is needed unless otherwise documented below in the visit note. 

## 2014-08-20 NOTE — Telephone Encounter (Signed)
[facility] - Client [place] Patient Name: Kristen Schwartz Gender: Female DOB: 01-18-1971 Age: 43 Y 10 M 28 D Return Phone Number: [phone] (Primary) Address: [place]/State/Zip: [place] [address] Client [facility] - Client Client Site [facility] - Day Physician Carolann Littler Contact Type Call Call Type Triage / Clinical Relationship To Patient Self Return Phone Number [phone] (Primary) Chief Complaint Lymph Node Swelling Initial Comment Caller states fever all night, side of neck swollen; hoarseness, on the same side had shingles (not now); has blisters under tongue; using salt water; PreDisposition Home Care Nurse Assessment Nurse: Vallery Sa, RN, Tye [place] Date/Time (Eastern Time): 08/20/2014 9:23:45 AM Confirm and document reason for call. If symptomatic, describe symptoms. ---Caller states she developed an enlarged left lymph node about 2 days ago, fever last night (about 100.0 last night) and blisters in her mouth yesterday. Has the patient traveled out of the country within the last 30 days? ---No Does the patient require triage? ---Yes Related visit to physician within the last 2 weeks? ---No Does the PT have any chronic conditions? (i.e. diabetes, asthma, etc.) ---Yes List chronic conditions. ---Shingles in the past Did the patient indicate they were pregnant? ---No Guidelines Guideline Title Affirmed Question Affirmed Notes Nurse Date/Time (Eastern Time) Lymph Nodes Swollen [1] Single large node AND [2] size > 1 inch (2.5 cm) AND [3] fever Trumbull, RN, Tye [place] 08/20/2014 9:25:43 AM Mouth Ulcers Large lymph node (> 1 inch or 2.5 cm) under the jaw Trumbull, RN, Cathy 08/20/2014 9:27:40 AM Disp. Time Eilene Ghazi Time) Disposition Final User 08/20/2014 9:27:11 AM See Physician within 4 Hours (or PCP triage) Vallery Sa, RN,  Tye [place] 08/20/2014 9:28:46 AM See Physician within 24 Hours Yes Trumbull, RN, Tye [place] PLEASE NOTE: All timestamps contained within this report are represented as [place] Standard Time. CONFIDENTIALTY NOTICE: This fax transmission is intended only for the addressee. It contains information that is legally privileged, confidential or otherwise protected from use or disclosure. If you are not the intended recipient, you are strictly prohibited from reviewing, disclosing, copying using or disseminating any of this information or taking any action in reliance on or regarding this information. If you have received this fax in error, please notify us immediately by telephone so that we can arrange for its return to [place]. Phone: [phone], Toll-Free: [phone], Fax: [phone] Page: 2 of 2 Call Id: [id number] [place] Understands: Yes Disagree/Comply: [name] Understands: Yes Disagree/Comply: Comply Care Advice Given Per Guideline SEE PHYSICIAN WITHIN 4 HOURS (or PCP triage): * IF NO PCP TRIAGE: You need to be seen. Go to _______________ (ED/UCC or office if it will be open) within the next 3 or 4 hours. Go sooner if you become worse. FEVER MEDICINES: * For fever relief, take acetaminophen or ibuprofen. * Treat fevers above 101 F (38.3 C). CALL BACK IF: * You become worse. CARE ADVICE given per Lymph Nodes Swollen (Adult) guideline. SEE PHYSICIAN WITHIN 24 HOURS: * IF OFFICE WILL BE OPEN: You need to be examined within the next 24 hours. Call your doctor when the office opens, and make an appointment. MOM AND BENADRYL MIXTURE FOR PAIN: * Mix equal amounts of Milk of Magnesia and Benadryl Allergy Liquid. * Every 4-6 hours swish a teaspoon of this mixture in your mouth, and then spit out. PAIN MEDICINES: ACETAMINOPHEN (E.G., TYLENOL): CALL BACK IF: * You become worse. CARE ADVICE given per Mouth Ulcers (Adult) guideline. After Care Instructions  Given Call Event Type User Date / Time  Description Comments User: Berton Mount, RN Date/Time Eilene Ghazi Time): 08/20/2014 9:32:25 AM Transferred to [place] B[place] via the Appointment Line to check on appointment options. Referrals REFERRED TO PCP OFFICE

## 2015-05-05 ENCOUNTER — Ambulatory Visit (INDEPENDENT_AMBULATORY_CARE_PROVIDER_SITE_OTHER): Payer: BLUE CROSS/BLUE SHIELD | Admitting: Family Medicine

## 2015-05-05 ENCOUNTER — Ambulatory Visit: Payer: Self-pay | Admitting: Family Medicine

## 2015-05-05 ENCOUNTER — Encounter: Payer: Self-pay | Admitting: Family Medicine

## 2015-05-05 VITALS — BP 120/80 | HR 79 | Temp 98.0°F | Wt 157.0 lb

## 2015-05-05 DIAGNOSIS — H6123 Impacted cerumen, bilateral: Secondary | ICD-10-CM | POA: Diagnosis not present

## 2015-05-05 DIAGNOSIS — H60543 Acute eczematoid otitis externa, bilateral: Secondary | ICD-10-CM

## 2015-05-05 MED ORDER — MOMETASONE FUROATE 0.1 % EX SOLN: Freq: Every day | CUTANEOUS | Status: DC

## 2015-05-05 NOTE — Progress Notes (Signed)
Pre visit review using our clinic review tool, if applicable. No additional management support is needed unless otherwise documented below in the visit note. 

## 2015-05-05 NOTE — Progress Notes (Signed)
   Subjective:    Patient ID: Kristen Schwartz, female    DOB: 08-31-1971, 44 y.o.   MRN: [id number]  HPI Patient seen with bilateral ear fullness right greater than left. long history of cerumen impactions. Using hydrogen peroxide without success. Some mild loss of hearing. No dizziness. No drainage. He has some dry skin and irritation and eczematous rash in both external canals possibly exacerbated by hydroperoxide.  Past Medical History  Diagnosis Date  . H/O: pneumonia   . Uterine fibroid   . Recurrent UTI    Past Surgical History  Procedure Laterality Date  . None      reports that she has never smoked. She does not have any smokeless tobacco history on file. She reports that she does not drink alcohol. Her drug history is not on file. family history includes Mitral valve prolapse in her mother. Allergies  Allergen Reactions  . Codeine Sulfate     REACTION: gi upset      Review of Systems  Constitutional: Negative for fever and chills.  HENT: Negative for ear discharge and ear pain.   Neurological: Negative for dizziness.       Objective:   Physical Exam  Constitutional: She appears well-developed and well-nourished.  HENT:  Mouth/Throat: Oropharynx is clear and moist.  Cerumen impaction both ear canals. She has scaly rash in both external canals and very dry skin  Cardiovascular: Normal rate and regular rhythm.   Pulmonary/Chest: Effort normal and breath sounds normal.          Assessment & Plan:  #1 cerumen impaction both ear canals. Removed with irrigation #2 dry eczematous rash ear canals. Probably exacerbated by hydroperoxide. Leave off hydroperoxide. Elocon lotion once daily as needed

## 2016-07-15 ENCOUNTER — Telehealth: Payer: Self-pay | Admitting: Family Medicine

## 2016-07-15 DIAGNOSIS — R8271 Bacteriuria: Secondary | ICD-10-CM | POA: Diagnosis not present

## 2016-07-15 DIAGNOSIS — N3000 Acute cystitis without hematuria: Secondary | ICD-10-CM | POA: Diagnosis not present

## 2016-07-15 DIAGNOSIS — R319 Hematuria, unspecified: Secondary | ICD-10-CM

## 2016-07-15 NOTE — Telephone Encounter (Signed)
LMTCB

## 2016-07-15 NOTE — Telephone Encounter (Signed)
Spoke with pt and she c/o back pain, hematuria with pain/burning. I offered appt with Tommi Rumps for today. Pt states that she "only wants to see a real doctor". She has self-referred herself to [facility] and they are asking for a referral in case her insurance needs one. She has been scheduled to see them at noon today as a new patient. She is asking for Dr Elease Hashimoto to complete a referral to [facility]. Advised pt that he is not in the office today, however I will send him a message and ask for the referral. She states that since he would send her to urology anyway she just wants to go ahead and be seen there.  Dr. Elease Hashimoto - Please advise as to urology referral. Thanks!

## 2016-07-15 NOTE — Telephone Encounter (Signed)
Patient Name: Kristen Schwartz  DOB: 02/21/1971    Initial Comment Caller having problems with kidneys, blood in urine, burning. Has been drinking cranberry juice,    Nurse Assessment  Nurse: Raphael Gibney, RN, Vanita Ingles Date/Time (Eastern Time): 07/15/2016 9:28:18 AM  Confirm and document reason for call. If symptomatic, describe symptoms. You must click the next button to save text entered. ---Caller states she has kidney problems. Has had a little blood in her urine. She has burning when she urinates. Has appt with the lab at noon. She is having back pain. She is drinking a lot of cranberry juice.  Has the patient traveled out of the country within the last 30 days? ---Not Applicable  Does the patient have any new or worsening symptoms? ---Yes  Will a triage be completed? ---Yes  Related visit to physician within the last 2 weeks? ---No  Does the PT have any chronic conditions? (i.e. diabetes, asthma, etc.) ---Yes  List chronic conditions. ---kidney problems  Is the patient pregnant or possibly pregnant? (Ask all females between the ages of 44-55) ---No  Is this a behavioral health or substance abuse call? ---No     Guidelines    Guideline Title Affirmed Question Affirmed Notes  Urination Pain - Female Side (flank) or lower back pain present    Final Disposition User   See Physician within 4 Hours (or PCP triage) Raphael Gibney, RN, Vera    Comments  pt states she is going to the lab at noon at [facility]. Please send order or referral.  please call pt back regarding order or referral to [facility].   Referrals  GO TO FACILITY OTHER - SPECIFY   Disagree/Comply: Comply

## 2016-07-16 NOTE — Telephone Encounter (Signed)
OK to refer.

## 2016-08-09 ENCOUNTER — Ambulatory Visit (INDEPENDENT_AMBULATORY_CARE_PROVIDER_SITE_OTHER): Payer: BLUE CROSS/BLUE SHIELD | Admitting: Family Medicine

## 2016-08-09 ENCOUNTER — Encounter: Payer: Self-pay | Admitting: Family Medicine

## 2016-08-09 VITALS — BP 110/86 | HR 82 | Temp 98.0°F | Wt 162.0 lb

## 2016-08-09 DIAGNOSIS — H60543 Acute eczematoid otitis externa, bilateral: Secondary | ICD-10-CM | POA: Diagnosis not present

## 2016-08-09 DIAGNOSIS — H6123 Impacted cerumen, bilateral: Secondary | ICD-10-CM | POA: Diagnosis not present

## 2016-08-09 NOTE — Addendum Note (Signed)
Addended by: Wyvonne Lenz on: 08/09/2016 03:27 PM   Modules accepted: Orders

## 2016-08-09 NOTE — Progress Notes (Signed)
Pre visit review using our clinic review tool, if applicable. No additional management support is needed unless otherwise documented below in the visit note. 

## 2016-08-09 NOTE — Patient Instructions (Signed)
Please use Elocon for eczema in ear canals. Ear wax softening drops can be used.    Earwax Buildup Your ears make a substance called earwax. It may also be called cerumen. Sometimes, too much earwax builds up in your ear canal. This can cause ear pain and make it harder for you to hear. CAUSES This condition is caused by too much earwax production or buildup. RISK FACTORS The following factors may make you more likely to develop this condition:  Cleaning your ears often with swabs.  Having narrow ear canals.  Having earwax that is overly thick or sticky.  Having eczema.  Being dehydrated. SYMPTOMS Symptoms of this condition include:  Reduced hearing.  Ear drainage.  Ear pain.  Ear itch.  A feeling of fullness in the ear or feeling that the ear is plugged.  Ringing in the ear.  Coughing. DIAGNOSIS Your health care provider can diagnose this condition based on your symptoms and medical history. Your health care provider will also do an ear exam to look inside your ear with a scope (otoscope). You may also have a hearing test. TREATMENT Treatment for this condition includes:  Over-the-counter or prescription ear drops to soften the earwax.  Earwax removal by a health care provider. This may be done:  By flushing the ear with body-temperature water.  With a medical instrument that has a loop at the end (earwax curette).  With a suction device. HOME CARE INSTRUCTIONS  Take over-the-counter and prescription medicines only as told by your health care provider.  Do not put any objects, including an ear swab, into your ear. You can clean the opening of your ear canal with a washcloth.  Drink enough water to keep your urine clear or pale yellow.  If you have frequent earwax buildup or you use hearing aids, consider seeing your health care provider every 6-12 months for routine preventive ear cleanings. Keep all follow-up visits as told by your health care  provider. SEEK MEDICAL CARE IF:  You have ear pain.  Your condition does not improve with treatment.  You have hearing loss.  You have blood, pus, or other fluid coming from your ear. This information is not intended to replace advice given to you by your health care provider. Make sure you discuss any questions you have with your health care provider. Document Released: 09/30/2004 Document Revised: 12/15/2015 Document Reviewed: 04/09/2015 Elsevier Interactive Patient Education  2017 [employer].

## 2016-08-09 NOTE — Progress Notes (Signed)
Subjective:    Patient ID: Kristen Schwartz, female    DOB: 1970/09/08, 45 y.o.   MRN: [id number]  HPI  Kristen Schwartz is a 45 year old who presents today ear fullness that has been present for 3 days. Recent history of "cold symptoms" that are resolving over the past week. She reports that ear fullness is greater in left ear than right ear. Associated symptom of mild loss of hearing. She denies fever, chills, sweats, dizziness, drainage,sinus pressure/pain, nasal congestion, sore throat, ear pain, cough, N/V/D, or myalgias. No aggravating or alleviating factors are noted. No treatments have been tried at home.   Review of Systems  Constitutional: Negative for chills, fatigue and fever.  HENT: Negative for congestion, postnasal drip, rhinorrhea, sinus pain, sinus pressure, sneezing and sore throat.   Respiratory: Negative for cough, shortness of breath and wheezing.   Cardiovascular: Negative for chest pain and palpitations.  Gastrointestinal: Negative for abdominal pain, diarrhea, nausea and vomiting.  Musculoskeletal: Negative for myalgias.  Neurological: Negative for dizziness, light-headedness and headaches.   Past Medical History:  Diagnosis Date  . H/O: pneumonia   . Recurrent UTI   . Uterine fibroid      Social History   Social History  . Marital status: Married    Spouse name: N/A  . Number of children: 0  . Years of education: N/A   Occupational History  . Not on file.   Social History Main Topics  . Smoking status: Never Smoker  . Smokeless tobacco: Not on file  . Alcohol use No  . Drug use: Unknown  . Sexual activity: Not on file   Other Topics Concern  . Not on file   Social History Narrative  . No narrative on file    Past Surgical History:  Procedure Laterality Date  . none      Family History  Problem Relation Age of Onset  . Mitral valve prolapse Mother     Allergies  Allergen Reactions  . Codeine Sulfate     REACTION: gi upset    Current  Outpatient Prescriptions on File Prior to Visit  Medication Sig Dispense Refill  . mometasone (ELOCON) 0.1 % lotion Apply topically daily. (Patient not taking: Reported on 08/09/2016) 60 mL 1   No current facility-administered medications on file prior to visit.     BP 110/86 (BP Location: Left Arm, Patient Position: Sitting, Cuff Size: Normal)   Pulse 82   Temp 98 F (36.7 C) (Oral)   Wt 162 lb (73.5 kg)   SpO2 98%   BMI 26.15 kg/m       Objective:   Physical Exam  Constitutional: She is oriented to person, place, and time. She appears well-developed and well-nourished.  HENT:  Right Ear: Tympanic membrane normal.  Left Ear: Tympanic membrane normal.  Dry scaly rash that appear to be extremely dry in external canals bilaterally  Eyes: Pupils are equal, round, and reactive to light. No scleral icterus.  Neck: Neck supple.  Cardiovascular: Normal rate and regular rhythm.   Pulmonary/Chest: Effort normal and breath sounds normal. She has no wheezes. She has no rales.  Lymphadenopathy:    She has no cervical adenopathy.  Neurological: She is alert and oriented to person, place, and time.  Skin: Skin is warm and dry. No rash noted.  Psychiatric: She has a normal mood and affect. Her behavior is normal. Judgment and thought content normal.       Assessment & Plan:  1. Bilateral impacted cerumen Irrigation of ears bilaterally completed. Able to visualize TMs. Advised use of ear wax softening drops on a preventive basis  2. Eczema of both external ears Advised patient to avoid use of hydrogen peroxide when cleaning ears. Elocon to use as needed for eczema noted.  Follow up if symptoms reoccur.   Delano Metz, FNP-C

## 2016-08-27 ENCOUNTER — Encounter: Payer: Self-pay | Admitting: Family Medicine

## 2016-08-27 ENCOUNTER — Telehealth: Payer: Self-pay | Admitting: Family Medicine

## 2016-08-27 ENCOUNTER — Ambulatory Visit (INDEPENDENT_AMBULATORY_CARE_PROVIDER_SITE_OTHER): Payer: BLUE CROSS/BLUE SHIELD | Admitting: Family Medicine

## 2016-08-27 ENCOUNTER — Ambulatory Visit: Payer: Self-pay

## 2016-08-27 VITALS — BP 106/75 | HR 93 | Temp 98.4°F | Resp 16 | Ht 66.0 in | Wt 159.8 lb

## 2016-08-27 DIAGNOSIS — K529 Noninfective gastroenteritis and colitis, unspecified: Secondary | ICD-10-CM

## 2016-08-27 MED ORDER — DIPHENOXYLATE-ATROPINE 2.5-0.025 MG PO TABS: ORAL_TABLET | ORAL | 0 refills | Status: DC

## 2016-08-27 NOTE — Telephone Encounter (Signed)
Patient Name: Kristen Schwartz  DOB: [birth year]    Initial Comment Hemma states she has been having diarrhea for three days.   Nurse Assessment  Nurse: Raphael Gibney, RN, Vanita Ingles Date/Time (Eastern Time): 08/27/2016 9:02:02 AM  Confirm and document reason for call. If symptomatic, describe symptoms. ---Caller states she has had diarrhea x 2 days. Last Friday, she had chills. Saturday and Sunday she was nauseated. Diarrhea started on Wednesday am and had 12 diarrhea stools. Had 5-6 stools yesterday and 3 very watery stools this am. No fever. No pain. She is eating jello and dry toast. No vomiting. She is drinking fluids. She is urinating.  Does the patient have any new or worsening symptoms? ---Yes  Will a triage be completed? ---Yes  Related visit to physician within the last 2 weeks? ---No  Does the PT have any chronic conditions? (i.e. diabetes, asthma, etc.) ---No  Is the patient pregnant or possibly pregnant? (Ask all females between the ages of 87-55) ---No  Is this a behavioral health or substance abuse call? ---No     Guidelines    Guideline Title Affirmed Question Affirmed Notes  Diarrhea [1] MODERATE diarrhea (e.g., 4-6 times / day more than normal) AND [2] present > 48 hours (2 days)    Final Disposition User   See Physician within 24 Hours [place], Therapist, sports, Vera    Comments  No appts available at [facility], or [facility]. Please call pt regarding appt.   Referrals  REFERRED TO PCP OFFICE  GO TO FACILITY REFUSED   Disagree/Comply: Comply

## 2016-08-27 NOTE — Telephone Encounter (Signed)
Please Advise Appointment scheduled for today at [facility]

## 2016-08-27 NOTE — Patient Instructions (Addendum)
Buy otc generic imodium and take as directed on the packaging.  Return if not improving by 08/31/16.

## 2016-08-27 NOTE — Progress Notes (Signed)
Pre visit review using our clinic review tool, if applicable. No additional management support is needed unless otherwise documented below in the visit note. 

## 2016-08-27 NOTE — Progress Notes (Signed)
OFFICE VISIT  08/27/2016   CC:  Chief Complaint  Patient presents with  . Diarrhea    x 3 days, taking pepto bismol   HPI:    Patient is a 45 y.o.  female who presents for diarrhea. Onset 2-3 d/a, watery BMs.  Worse in AM, seems to slack off in PM.  Number of stools per day about 5-10. No blood noted in stool.  Has had nausea x 1 week, no vomiting.  The nausea has eased up quite a bit last few days.  Trying to focus on bland food and clear liquids. No fevers.  Slight lightheadedness.  No antibiotics in the last month.  She had a UTI tx'd with abx but she thinks this is >4-6 wks ago.  No nocturnal diarrhea. No abd pain.  No malaise. No sick contacts recently.  No recent travel or camping.  No unusual foods eaten prior to onset.   Past Medical History:  Diagnosis Date  . H/O: pneumonia   . Recurrent UTI   . Uterine fibroid     Past Surgical History:  Procedure Laterality Date  . none     MEDS: none currently except pepto bismol  Allergies  Allergen Reactions  . Codeine Sulfate     REACTION: gi upset    ROS As per HPI  PE: Blood pressure 106/75, pulse 93, temperature 98.4 F (36.9 C), temperature source Oral, resp. rate 16, height 5\' 6"  (1.676 m), weight 159 lb 12 oz (72.5 kg), SpO2 99 %. Pt examined with Starla Link, CMA, as chaperone. Gen: Alert, well appearing.  Patient is oriented to person, place, time, and situation. [id number]: no injection, icteris, swelling, or exudate.  EOMI, PERRLA. Mouth: lips without lesion/swelling.  Oral mucosa pink and moist. Oropharynx without erythema, exudate, or swelling.  CV: RRR, no m/r/g.   LUNGS: CTA bilat, nonlabored resps, good aeration in all lung fields. ABD: soft, NT/ND, BS normal. EXT: no clubbing, cyanosis, or edema.   LABS:  none  IMPRESSION AND PLAN:  Gastroenteritis, suspect viral etiology. Will focus on trying to slow stooling down and decrease fluid loss. Try otc generic imodium first x 1 day. If not helpful,  then fill rx for lomotil that I printed and handed to pt. If not improving by 08/31/16, return for recheck---we'll get stool collection at that point.  An After Visit Summary was printed and given to the patient.  FOLLOW UP: Return if symptoms worsen or fail to improve.  Signed:  Crissie Sickles, MD           08/27/2016

## 2016-10-29 ENCOUNTER — Telehealth: Payer: Self-pay | Admitting: Family Medicine

## 2016-10-29 MED ORDER — OSELTAMIVIR PHOSPHATE 75 MG PO CAPS: 75.0000 mg | ORAL_CAPSULE | Freq: Every day | ORAL | 0 refills | Status: DC

## 2016-10-29 NOTE — Telephone Encounter (Signed)
OK 

## 2016-10-29 NOTE — Telephone Encounter (Signed)
Pt daughter was dx with flu on 2-17 and pt would like tami flu send into [facility] [address]

## 2016-10-29 NOTE — Telephone Encounter (Signed)
Okay? Last seen by you 05/05/2015 Jefm Miles, NP on 08/27/2016 acute visit

## 2016-10-29 NOTE — Telephone Encounter (Signed)
Medication sent in for patient. 

## 2017-03-07 DIAGNOSIS — H5340 Unspecified visual field defects: Secondary | ICD-10-CM | POA: Diagnosis not present

## 2017-03-07 DIAGNOSIS — Z83511 Family history of glaucoma: Secondary | ICD-10-CM | POA: Diagnosis not present

## 2017-03-07 DIAGNOSIS — H40013 Open angle with borderline findings, low risk, bilateral: Secondary | ICD-10-CM | POA: Diagnosis not present

## 2017-09-23 DIAGNOSIS — H40013 Open angle with borderline findings, low risk, bilateral: Secondary | ICD-10-CM | POA: Diagnosis not present

## 2018-02-01 ENCOUNTER — Ambulatory Visit (INDEPENDENT_AMBULATORY_CARE_PROVIDER_SITE_OTHER): Payer: BLUE CROSS/BLUE SHIELD | Admitting: Family Medicine

## 2018-02-01 ENCOUNTER — Encounter: Payer: Self-pay | Admitting: Family Medicine

## 2018-02-01 VITALS — BP 126/84 | HR 92 | Temp 98.2°F | Ht 66.0 in | Wt 167.2 lb

## 2018-02-01 DIAGNOSIS — R05 Cough: Secondary | ICD-10-CM | POA: Diagnosis not present

## 2018-02-01 DIAGNOSIS — H6123 Impacted cerumen, bilateral: Secondary | ICD-10-CM

## 2018-02-01 DIAGNOSIS — R053 Chronic cough: Secondary | ICD-10-CM

## 2018-02-01 DIAGNOSIS — H60543 Acute eczematoid otitis externa, bilateral: Secondary | ICD-10-CM

## 2018-02-01 MED ORDER — MOMETASONE FUROATE 0.1 % EX SOLN: Freq: Every day | CUTANEOUS | 2 refills | Status: DC

## 2018-02-01 NOTE — Progress Notes (Signed)
  Subjective:     Patient ID: Kristen Schwartz, female   DOB: 04-Apr-1971, 47 y.o.   MRN: [id number]  HPI Patient here for several things today as follows  She's had some fullness in both ears and has had history of cerumen build up in the past. She tried some Debrox on her own without much improvement. No ear pain. No drainage.  Second issue she states she had typical URI about 2 months ago. She's had some minimal dry persistent cough since then. No hemoptysis. No appetite or weight changes. No wheezing. Occasional GERD symptoms.Nasal drip symptoms. No ACE inhibitor use. No exacerbating factors.  Non-smoker.  She has history of recurrent scaling and itching of her external canals. Is requesting medication for that.  Past Medical History:  Diagnosis Date  . H/O: pneumonia   . Recurrent UTI   . Uterine fibroid    Past Surgical History:  Procedure Laterality Date  . none      reports that she has never smoked. She does not have any smokeless tobacco history on file. She reports that she does not drink alcohol or use drugs. family history includes Mitral valve prolapse in her mother. Allergies  Allergen Reactions  . Codeine Sulfate     REACTION: gi upset     Review of Systems  Constitutional: Negative for chills and fever.  HENT: Positive for hearing loss. Negative for congestion, ear discharge, ear pain, postnasal drip, sinus pressure and sinus pain.   Respiratory: Positive for cough. Negative for shortness of breath and wheezing.   Cardiovascular: Negative for chest pain.       Objective:   Physical Exam  Constitutional: She appears well-developed and well-nourished.  HENT:  She has cerumen impaction bilaterally  Neck: Neck supple.  Cardiovascular: Normal rate and regular rhythm.  Pulmonary/Chest: Effort normal and breath sounds normal. No respiratory distress. She has no wheezes. She has no rales.  Musculoskeletal: She exhibits no edema.  Skin: No rash noted.        Assessment:     #1 bilateral cerumen impaction  #2 persistent dry cough following what sounds like viral URI. Nonfocal exam. Question GERD component  #3 history of eczematous changes ear canals    Plan:     -Irrigation of both ear canals- with removal of cerumen. -Recommend consider trial of over-the-counter Pepcid 20 mg or Zantac 150 mg twice daily to help control reflux symptoms -Touch base if cough not resolving in 2-3 weeks -Elocon 0.1% lotion to use to ear canals once daily as needed for eczema flareups -Recommend she schedule complete physical  Eulas Post MD [facility] at [facility]

## 2018-02-01 NOTE — Patient Instructions (Signed)
Consider getting back on Pepcid 20 mg or Zantac 150 mg twice daily  Let me know if cough no better in 2-3 weeks.

## 2018-03-31 DIAGNOSIS — H40013 Open angle with borderline findings, low risk, bilateral: Secondary | ICD-10-CM | POA: Diagnosis not present

## 2018-11-06 ENCOUNTER — Ambulatory Visit: Payer: Self-pay

## 2018-11-06 NOTE — Telephone Encounter (Signed)
Incoming  Call from Patient with  A  Complaint of heart rate speeding  Up at  Least  3 times a year.   And 2 times  In the past 2 months.  Patient  Reports muscles spasms,  Cramping ,  Skins feels like  Sunburn.  Last  Episode was  Friday.  It  Usually      Occurs at  Night.  Denies SOB. Patient  Scheduled  For 11/07/18  Tuesday.  Patient  Is  To  Arrive  At  2:45pm Patient  Voices understanding.      Reason for Disposition . [1] Palpitations AND [2] no improvement after using CARE ADVICE  Answer Assessment - Initial Assessment Questions 1. DESCRIPTION: "Please describe your heart rate or heart beat that you are having" (e.g., fast/slow, regular/irregular, skipped or extra beats, "palpitations")     Start  shaking 2. ONSET: "When did it start?" (Minutes, hours or days)      After the  Birth of  My  daugter then  Years  Ago.   3. DURATION: "How long does it last" (e.g., seconds, minutes, hours)      Ten  To  15 minutes 4. PATTERN "Does it come and go, or has it been constant since it started?"  "Does it get worse with exertion?"   "Are you feeling it now?"     Yes.  Hits  All of  A  sudden 5. TAP: "Using your hand, can you tap out what you are feeling on a chair or table in front of you, so that I can hear?" (Note: not all patients can do this)       *No Answer* 6. HEART RATE: "Can you tell me your heart rate?" "How many beats in 15 seconds?"  (Note: not all patients can do this)       96  Normal  72 and  80 7. RECURRENT SYMPTOM: "Have you ever had this before?" If so, ask: "When was the last time?" and "What happened that time?"       yes 8. CAUSE: "What do you think is causing the palpitations?"     no 9. CARDIAC HISTORY: "Do you have any history of heart disease?" (e.g., heart attack, angina, bypass surgery, angioplasty, arrhythmia)     na 10. OTHER SYMPTOMS: "Do you have any other symptoms?" (e.g., dizziness, chest pain, sweating, difficulty breathing)       denies 11. PREGNANCY: "Is there  any chance you are pregnant?" "When was your last menstrual period?"       menopausal  Protocols used: HEART RATE AND HEARTBEAT QUESTIONS-A-AH

## 2018-11-07 ENCOUNTER — Ambulatory Visit (INDEPENDENT_AMBULATORY_CARE_PROVIDER_SITE_OTHER): Payer: BLUE CROSS/BLUE SHIELD | Admitting: Family Medicine

## 2018-11-07 ENCOUNTER — Encounter: Payer: Self-pay | Admitting: Family Medicine

## 2018-11-07 ENCOUNTER — Other Ambulatory Visit: Payer: Self-pay

## 2018-11-07 VITALS — BP 128/82 | HR 81 | Temp 98.2°F | Ht 65.1 in | Wt 165.0 lb

## 2018-11-07 DIAGNOSIS — R002 Palpitations: Secondary | ICD-10-CM

## 2018-11-07 NOTE — Patient Instructions (Signed)
Palpitations  Palpitations are feelings that your heartbeat is irregular or is faster than normal. It may feel like your heart is fluttering or skipping a beat. Palpitations are usually not a serious problem. They may be caused by many things, including smoking, caffeine, alcohol, stress, and certain medicines or drugs. Most causes of palpitations are not serious. However, some palpitations can be a sign of a serious problem. You may need further tests to rule out serious medical problems.  Follow these instructions at home:         Pay attention to any changes in your condition. Take these actions to help manage your symptoms:  Eating and drinking  Avoid foods and drinks that may cause palpitations. These may include:  Caffeinated coffee, tea, soft drinks, diet pills, and energy drinks.  Chocolate.  Alcohol.  Lifestyle  Take steps to reduce your stress and anxiety. Things that can help you relax include:  Yoga.  Mind-body activities, such as deep breathing, meditation, or using words and images to create positive thoughts (guided imagery).  Physical activity, such as swimming, jogging, or walking. Tell your health care provider if your palpitations increase with activity. If you have chest pain or shortness of breath with activity, do not continue the activity until you are seen by your health care provider.  Biofeedback. This is a method that helps you learn to use your mind to control things in your body, such as your heartbeat.  Do not use drugs, including cocaine or ecstasy. Do not use marijuana.  Get plenty of rest and sleep. Keep a regular bed time.  General instructions  Take over-the-counter and prescription medicines only as told by your health care provider.  Do not use any products that contain nicotine or tobacco, such as cigarettes and e-cigarettes. If you need help quitting, ask your health care provider.  Keep all follow-up visits as told by your health care provider. This is important. These may  include visits for further testing if palpitations do not go away or get worse.  Contact a health care provider if you:  Continue to have a fast or irregular heartbeat after 24 hours.  Notice that your palpitations occur more often.  Get help right away if you:  Have chest pain or shortness of breath.  Have a severe headache.  Feel dizzy or you faint.  Summary  Palpitations are feelings that your heartbeat is irregular or is faster than normal. It may feel like your heart is fluttering or skipping a beat.  Palpitations may be caused by many things, including smoking, caffeine, alcohol, stress, certain medicines, and drugs.  Although most causes of palpitations are not serious, some causes can be a sign of a serious medical problem.  Get help right away if you faint or have chest pain, shortness of breath, a severe headache, or dizziness.  This information is not intended to replace advice given to you by your health care provider. Make sure you discuss any questions you have with your health care provider.  Document Released: 08/20/2000 Document Revised: 10/05/2017 Document Reviewed: 10/05/2017  Elsevier Interactive Patient Education  2019 [employer]vier Inc[employer]

## 2018-11-07 NOTE — Progress Notes (Signed)
  Subjective:     Patient ID: Kristen Schwartz, female   DOB: [birth year], 48 y.o.   MRN: [id number]  HPI Patient is seen with periodic palpitations.  She states she has had about 3 episodes in the past year, most recently this past Friday .  With each occasion she remembers feeling some stress during episodes.  On 2 of 3 occasions she just learned about the death of someone in the family.  No history of panic disorder.  She states she has had the sensation of increased heart rate.  She checked on one occasion her heart rate was 96.  No associated fevers or chills.  She states that during episodes sometimes she feels somewhat shaky.  No loss of consciousness.  No syncope.  No associated dyspnea or chest pain..  She did not recall any hyperventilation.  She takes no medications.  No decongestant use.  Rare caffeine use.  She is not aware of any recent appetite changes.  She has had some gradual weight gain recently.  No recent lab work.  Past Medical History:  Diagnosis Date  . H/O: pneumonia   . Recurrent UTI   . Uterine fibroid    Past Surgical History:  Procedure Laterality Date  . GALLBLADDER SURGERY    . none      reports that she has never smoked. She has never used smokeless tobacco. She reports that she does not drink alcohol or use drugs. family history includes Mitral valve prolapse in her mother. Allergies  Allergen Reactions  . Codeine Sulfate     REACTION: gi upset     Review of Systems  Constitutional: Negative for appetite change, chills, fatigue, fever and unexpected weight change.  Eyes: Negative for visual disturbance.  Respiratory: Negative for cough, chest tightness, shortness of breath and wheezing.   Cardiovascular: Positive for palpitations. Negative for chest pain and leg swelling.  Neurological: Negative for dizziness, seizures, syncope, weakness, light-headedness and headaches.       Objective:   Physical Exam Constitutional:      Appearance: She is  well-developed.  Eyes:     Pupils: Pupils are equal, round, and reactive to light.  Neck:     Musculoskeletal: Neck supple.     Thyroid: No thyromegaly.     Vascular: No JVD.  Cardiovascular:     Rate and Rhythm: Normal rate and regular rhythm.     Heart sounds: No murmur. No gallop.   Pulmonary:     Effort: Pulmonary effort is normal. No respiratory distress.     Breath sounds: Normal breath sounds. No wheezing or rales.  Musculoskeletal:     Right lower leg: No edema.     Left lower leg: No edema.  Neurological:     Mental Status: She is alert.        Assessment:     #1 history of infrequent tachypalpitations.  She describes 3 different episodes lasting about 15 minutes each over the past year or so..  No hx of panic disorder.  ?Anxiety related.      Plan:     -Check EKG along with labs including basic metabolic panel, magnesium level, TSH, CBC -EKG shows sinus rhythm with non-specific T wave changes. -minimize caffeine use. -discussed possible event monitor if episodes become more frequent (Only 3 in past year)  Eulas Post MD [facility] at [facility]

## 2018-11-08 ENCOUNTER — Encounter: Payer: Self-pay | Admitting: Family Medicine

## 2018-11-08 LAB — CBC WITH DIFFERENTIAL/PLATELET
Basophils Absolute: 0.1 10*3/uL (ref 0.0–0.1)
Basophils Relative: 0.7 % (ref 0.0–3.0)
Eosinophils Absolute: 0.3 10*3/uL (ref 0.0–0.7)
Eosinophils Relative: 3.6 % (ref 0.0–5.0)
HCT: 39.3 % (ref 36.0–46.0)
Hemoglobin: 13.2 g/dL (ref 12.0–15.0)
LYMPHS ABS: 2.6 10*3/uL (ref 0.7–4.0)
Lymphocytes Relative: 32.5 % (ref 12.0–46.0)
MCHC: 33.7 g/dL (ref 30.0–36.0)
MCV: 94.5 fl (ref 78.0–100.0)
MONOS PCT: 5.4 % (ref 3.0–12.0)
Monocytes Absolute: 0.4 10*3/uL (ref 0.1–1.0)
NEUTROS PCT: 57.8 % (ref 43.0–77.0)
Neutro Abs: 4.7 10*3/uL (ref 1.4–7.7)
Platelets: 330 10*3/uL (ref 150.0–400.0)
RBC: 4.16 Mil/uL (ref 3.87–5.11)
RDW: 13 % (ref 11.5–15.5)
WBC: 8 10*3/uL (ref 4.0–10.5)

## 2018-11-08 LAB — MAGNESIUM: Magnesium: 1.9 mg/dL (ref 1.5–2.5)

## 2018-11-08 LAB — BASIC METABOLIC PANEL
BUN: 16 mg/dL (ref 6–23)
CO2: 31 mEq/L (ref 19–32)
Calcium: 9.5 mg/dL (ref 8.4–10.5)
Chloride: 101 mEq/L (ref 96–112)
Creatinine, Ser: 1.03 mg/dL (ref 0.40–1.20)
GFR: 56.69 mL/min — ABNORMAL LOW (ref >60.00)
Glucose, Bld: 97 mg/dL (ref 70–99)
Potassium: 4.1 mEq/L (ref 3.5–5.1)
Sodium: 140 mEq/L (ref 135–145)

## 2018-11-08 LAB — TSH: TSH: 0.89 u[IU]/mL (ref 0.35–4.50)

## 2018-11-09 NOTE — Telephone Encounter (Signed)
Pt is not able to get into Mychart. She states she is still having the muscle spasm issue and would like Dr. Elease Hashimoto or nurse to call her to discuss the next step and well as the results. / please advise

## 2018-11-10 ENCOUNTER — Other Ambulatory Visit: Payer: Self-pay

## 2018-11-10 MED ORDER — CYCLOBENZAPRINE HCL 5 MG PO TABS: 5.0000 mg | ORAL_TABLET | Freq: Every day | ORAL | 0 refills | Status: DC

## 2018-11-24 ENCOUNTER — Telehealth: Payer: Self-pay | Admitting: Family Medicine

## 2018-11-24 NOTE — Telephone Encounter (Signed)
Called patient and have her scheduled on 11/28/18 at 8am.

## 2018-11-24 NOTE — Telephone Encounter (Signed)
I think at least some of this may be related to anxiety symptoms.  Consider follow up to discuss options.

## 2018-11-24 NOTE — Telephone Encounter (Signed)
Please see message.  Please advise. 

## 2018-11-24 NOTE — Telephone Encounter (Signed)
Copied from [place] [id number]. Topic: Quick Communication - See Telephone Encounter >> Nov 24, 2018 10:36 AM Rayann Heman wrote: CRM for notification. See Telephone encounter for: 11/24/18. Pt called and stated that she is feeling very shakes inside of her body. Pt states that it happens when she is being still. Pt states that when she wakes up in the morning her arms feel sore. Pt states that she feels like its her nerves. Pt would like a call back from the nurse to discuss referral . Please advise

## 2018-11-28 ENCOUNTER — Ambulatory Visit (INDEPENDENT_AMBULATORY_CARE_PROVIDER_SITE_OTHER): Payer: BLUE CROSS/BLUE SHIELD | Admitting: Family Medicine

## 2018-11-28 ENCOUNTER — Other Ambulatory Visit: Payer: Self-pay

## 2018-11-28 ENCOUNTER — Encounter: Payer: Self-pay | Admitting: Family Medicine

## 2018-11-28 VITALS — BP 136/84 | HR 82 | Temp 96.8°F | Ht 65.1 in | Wt 163.4 lb

## 2018-11-28 DIAGNOSIS — R251 Tremor, unspecified: Secondary | ICD-10-CM | POA: Diagnosis not present

## 2018-11-28 MED ORDER — METOPROLOL SUCCINATE ER 25 MG PO TB24: 25.0000 mg | ORAL_TABLET | Freq: Every day | ORAL | 3 refills | Status: DC

## 2018-11-28 NOTE — Patient Instructions (Signed)

## 2018-11-28 NOTE — Progress Notes (Signed)
Subjective:     Patient ID: Kristen Schwartz, female   DOB: Apr 17, 1971, 48 y.o.   MRN: [id number]  HPI Patient is here to assess somewhat of a "vibratory, shaking, trembling" feeling anterior chest predominantly and mostly when she lays down at night.  Refer to previous note for details from 11-07-18:  "Patient is seen with periodic palpitations.  She states she has had about 3 episodes in the past year, most recently this past Friday .  With each occasion she remembers feeling some stress during episodes.  On 2 of 3 occasions she just learned about the death of someone in the family.  No history of panic disorder.  She states she has had the sensation of increased heart rate.  She checked on one occasion her heart rate was 96.  No associated fevers or chills.  She states that during episodes sometimes she feels somewhat shaky.  No loss of consciousness.  No syncope.  No associated dyspnea or chest pain..  She did not recall any hyperventilation.  She takes no medications.  No decongestant use.  Rare caffeine use.  She is not aware of any recent appetite changes.  She has had some gradual weight gain recently.  No recent lab work"  She had lab work including thyroid functions that were normal.  She today has really not complained of palpitations such as vibratory sensation.  She states that her father had some type of tremor since his 46s or 30s and presumably this was essential tremor.  There is no family history of Parkinson's in her parents.  Patient denies any exacerbating or alleviating factors.  No focal weakness.  No numbness.  She has had some anxiety symptoms with recent passing away of uncle and has had poor sleep at times.  Denies any chest pain.  No exertional symptoms.  No dyspnea. Does not feel depressed.  No visual changes.  No paresthesias .    Past Medical History:  Diagnosis Date  . H/O: pneumonia   . Recurrent UTI   . Uterine fibroid    Past Surgical History:  Procedure Laterality  Date  . GALLBLADDER SURGERY    . none      reports that she has never smoked. She has never used smokeless tobacco. She reports that she does not drink alcohol or use drugs. family history includes Mitral valve prolapse in her mother. Allergies  Allergen Reactions  . Codeine Sulfate     REACTION: gi upset    Review of Systems  Constitutional: Negative for appetite change, chills and fever.  Respiratory: Negative for shortness of breath.   Cardiovascular: Negative for chest pain.  Neurological: Positive for tremors. Negative for dizziness, seizures, syncope, facial asymmetry, speech difficulty, weakness, light-headedness, numbness and headaches.  Hematological: Negative for adenopathy. Does not bruise/bleed easily.  Psychiatric/Behavioral: Negative for confusion.       Objective:   Physical Exam Constitutional:      Appearance: Normal appearance.  Neck:     Musculoskeletal: Neck supple.  Cardiovascular:     Pulses: Normal pulses.     Heart sounds: Normal heart sounds.  Pulmonary:     Effort: Pulmonary effort is normal.     Breath sounds: Normal breath sounds. No wheezing or rales.  Neurological:     General: No focal deficit present.     Mental Status: She is alert and oriented to person, place, and time.     Cranial Nerves: No cranial nerve deficit.     Motor:  No weakness.     Coordination: Coordination normal.     Gait: Gait normal.     Comments: Full strength throughout.  Normal sensory function throughout        Assessment:     Patient presents with vague symptoms of vibratory sensation mostly anterior chest.  She does not describe any clear palpitations.  She does not have any objective evidence for tremor on exam or any focal neurologic deficits.  She does not think these are anxiety related.  She had expressed concerns about MS but we did not see any obvious neurologic deficits at this point    Plan:     -We discussed possible use of low-dose beta-blocker  such as Toprol-XL 25 mg daily to see if this blunts her symptoms.  She is undecided at this point but will consider and prescription is written  -Consider neurology referral if symptoms persist or she has any new symptoms such as sensory deficits, weakness, or visible tremor  Eulas Post MD [facility]

## 2018-12-04 ENCOUNTER — Encounter: Payer: Self-pay | Admitting: Family Medicine

## 2018-12-05 ENCOUNTER — Other Ambulatory Visit: Payer: Self-pay

## 2018-12-05 MED ORDER — METOPROLOL SUCCINATE ER 25 MG PO TB24: 25.0000 mg | ORAL_TABLET | Freq: Every day | ORAL | 3 refills | Status: DC

## 2019-03-13 DIAGNOSIS — R8271 Bacteriuria: Secondary | ICD-10-CM | POA: Diagnosis not present

## 2019-03-13 DIAGNOSIS — N134 Hydroureter: Secondary | ICD-10-CM | POA: Diagnosis not present

## 2019-03-13 DIAGNOSIS — N201 Calculus of ureter: Secondary | ICD-10-CM | POA: Diagnosis not present

## 2019-03-13 DIAGNOSIS — R350 Frequency of micturition: Secondary | ICD-10-CM | POA: Diagnosis not present

## 2019-03-13 DIAGNOSIS — D259 Leiomyoma of uterus, unspecified: Secondary | ICD-10-CM | POA: Diagnosis not present

## 2019-03-13 DIAGNOSIS — R3915 Urgency of urination: Secondary | ICD-10-CM | POA: Diagnosis not present

## 2019-03-13 DIAGNOSIS — N1330 Unspecified hydronephrosis: Secondary | ICD-10-CM | POA: Diagnosis not present

## 2019-04-24 DIAGNOSIS — H40013 Open angle with borderline findings, low risk, bilateral: Secondary | ICD-10-CM | POA: Diagnosis not present

## 2019-06-11 ENCOUNTER — Other Ambulatory Visit: Payer: Self-pay | Admitting: Family Medicine

## 2019-06-11 MED ORDER — METOPROLOL SUCCINATE ER 25 MG PO TB24: 25.0000 mg | ORAL_TABLET | Freq: Every day | ORAL | 0 refills | Status: DC

## 2019-06-11 NOTE — Telephone Encounter (Signed)
Medication Refill - Medication:  metoprolol succinate (TOPROL-XL) 25 MG 24 hr tablet  Has the patient contacted their pharmacy? Yes advised to call office.   Preferred Pharmacy (with phone number or street name):  [id number] - [place], [place] [phone] (Phone) [phone] (Fax)   Agent: Please be advised that RX refills may take up to 3 business days. We ask that you follow-up with your pharmacy.

## 2019-07-20 ENCOUNTER — Other Ambulatory Visit: Payer: Self-pay | Admitting: Family Medicine

## 2019-09-10 ENCOUNTER — Other Ambulatory Visit: Payer: Self-pay | Admitting: Family Medicine

## 2019-10-08 ENCOUNTER — Other Ambulatory Visit: Payer: Self-pay | Admitting: Family Medicine

## 2019-10-22 ENCOUNTER — Ambulatory Visit (INDEPENDENT_AMBULATORY_CARE_PROVIDER_SITE_OTHER): Payer: BC Managed Care – PPO | Admitting: Family Medicine

## 2019-10-22 ENCOUNTER — Other Ambulatory Visit: Payer: Self-pay

## 2019-10-22 ENCOUNTER — Encounter: Payer: Self-pay | Admitting: Family Medicine

## 2019-10-22 VITALS — BP 118/78 | HR 97 | Temp 97.2°F | Ht 65.1 in | Wt 161.1 lb

## 2019-10-22 DIAGNOSIS — R002 Palpitations: Secondary | ICD-10-CM

## 2019-10-22 NOTE — Patient Instructions (Signed)

## 2019-10-22 NOTE — Progress Notes (Signed)
  Subjective:     Patient ID: Kristen Schwartz, female   DOB: 1971-06-12, 49 y.o.   MRN: [id number]  HPI   Kristen Schwartz is seen with some recent increased palpitations.  She had several bouts last year and she had EKGs which were unremarkable.  We had placed her on low-dose metoprolol and taking just half of a 25 mg tablet she felt like these were suppressed for several months until about a week ago.  She has had some episodes usually lasting about 5 minutes where she felt like her heart rate is up over 100.  These have generally been occurring about 3-4 times per week over the past week.  No history of panic disorder or panic type symptoms.  No caffeine use.  She has eaten some chocolate recently.  No alcohol use.  She went through menopause around age 68.  She has not had any recent flushing.  No diarrhea symptoms.  No sweats.  No abdominal pain.  She had thyroid functions, magnesium, and electrolytes back in March which were unremarkable.  She had echocardiogram way back in 2007 for similar symptoms and this was reviewed and no major structural heart problems  Past Medical History:  Diagnosis Date  . H/O: pneumonia   . Recurrent UTI   . Uterine fibroid    Past Surgical History:  Procedure Laterality Date  . GALLBLADDER SURGERY    . none      reports that she has never smoked. She has never used smokeless tobacco. She reports that she does not drink alcohol or use drugs. family history includes Mitral valve prolapse in her mother. Allergies  Allergen Reactions  . Codeine Sulfate     REACTION: gi upset    Review of Systems  Constitutional: Negative for appetite change, chills, fatigue, fever and unexpected weight change.  Eyes: Negative for visual disturbance.  Respiratory: Negative for cough, chest tightness, shortness of breath and wheezing.   Cardiovascular: Positive for palpitations. Negative for chest pain and leg swelling.  Gastrointestinal: Negative for abdominal pain and diarrhea.   Genitourinary: Negative for dysuria.  Neurological: Negative for dizziness, seizures, syncope, weakness, light-headedness and headaches.       Objective:   Physical Exam Vitals reviewed.  Constitutional:      Appearance: Normal appearance.  Cardiovascular:     Rate and Rhythm: Normal rate and regular rhythm.     Heart sounds: No murmur. No gallop.   Pulmonary:     Effort: Pulmonary effort is normal.     Breath sounds: Normal breath sounds.  Musculoskeletal:     Right lower leg: No edema.     Left lower leg: No edema.  Neurological:     Mental Status: She is alert.        Assessment:     Intermittent palpitations.  She has had these intermittently for years.  Had been doing well with low-dose metoprolol until recently.  Previous electrolyte testing unremarkable.  She does not have any red flag symptoms such as chest pains, syncope, dizziness    Plan:     -Continue to avoid caffeine including chocolate -Set up cardiac event monitor to further assess -Increase metoprolol XL to full 25 mg tablet.  Could further titrate to 50 mg if necessary  Eulas Post MD  Primary Care at [facility]

## 2019-10-31 ENCOUNTER — Ambulatory Visit (INDEPENDENT_AMBULATORY_CARE_PROVIDER_SITE_OTHER): Payer: BC Managed Care – PPO

## 2019-10-31 DIAGNOSIS — R002 Palpitations: Secondary | ICD-10-CM | POA: Diagnosis not present

## 2019-11-14 ENCOUNTER — Encounter: Payer: Self-pay | Admitting: Family Medicine

## 2019-11-21 ENCOUNTER — Encounter: Payer: Self-pay | Admitting: Family Medicine

## 2019-11-21 ENCOUNTER — Other Ambulatory Visit: Payer: Self-pay | Admitting: Family Medicine

## 2019-11-21 DIAGNOSIS — R002 Palpitations: Secondary | ICD-10-CM

## 2019-11-21 DIAGNOSIS — R251 Tremor, unspecified: Secondary | ICD-10-CM

## 2019-11-21 MED ORDER — METOPROLOL SUCCINATE ER 25 MG PO TB24: 25.0000 mg | ORAL_TABLET | Freq: Every day | ORAL | 2 refills | Status: DC

## 2019-11-22 NOTE — Telephone Encounter (Signed)
Spoke to pt and she advised that her sx are no better. I asked pt if she would like the referral for cardio. Pt stated that if this is what she is needed. Pt advised that if sx worsen or persist.

## 2019-11-22 NOTE — Telephone Encounter (Signed)
Please advise 

## 2019-11-23 NOTE — Telephone Encounter (Signed)
[place] for referral to Cardio?

## 2019-11-26 NOTE — Telephone Encounter (Signed)
Referral placed.

## 2019-11-29 ENCOUNTER — Encounter: Payer: Self-pay | Admitting: Family Medicine

## 2020-01-09 NOTE — Progress Notes (Signed)
Cardiology Office Note:    Date:  01/10/2020   ID:  Kristen Schwartz, DOB 05-Mar-1971, MRN [id number]  PCP:  Eulas Post, MD  Cardiologist:  No primary care provider on file.   Referring MD: Eulas Post, MD   Chief Complaint  Patient presents with  . Irregular Heart Beat    History of Present Illness:    Kristen Schwartz is a 49 y.o. female with a hx of palpitations here for cardiology consultation..  Greater than 18-year history of intermittent increase in heart rate and tremor that occur spontaneously.  Early on these episodes will last minutes to 1/2-hour.  Over the years these episodes have progressed to the last hours.  Her very first episode occurred shortly after she delivered her daughter.  More recently approximately 1 month after recovering from COVID-19 infection she began noticing numbness, arm heaviness, tingling in arms and legs, sweating, tremor, which caused her to have concerned that there was a heart problem.  She has been previously given metoprolol succinate 12.5 mg/day 3 years ago.  With the most recent symptoms she doubled the dose to 25 mg/day and this dramatically improved the uneasiness and sensation of tremor/shaking.  After increasing the dose of medication she wondered if she even needed to come into the office because she has felt so much better.  She is otherwise very active, never had syncope, denies orthopnea, PND, and lower extremity swelling.  No chest pain.  No pre-existing heart history.  Mother has mitral valve prolapse.  She does not smoke and is not diabetic.  Past Medical History:  Diagnosis Date  . H/O: pneumonia   . Recurrent UTI   . Uterine fibroid     Past Surgical History:  Procedure Laterality Date  . GALLBLADDER SURGERY    . none      Current Medications: Current Meds  Medication Sig  . Lactobacillus (PROBIOTIC ACIDOPHILUS PO) Take 1 tablet by mouth daily.  . metoprolol succinate (TOPROL-XL) 25 MG 24 hr tablet Take 1  tablet (25 mg total) by mouth daily.  . mometasone (ELOCON) 0.1 % lotion Apply topically daily.     Allergies:   Codeine sulfate   Social History   Socioeconomic History  . Marital status: Married    Spouse name: Not on file  . Number of children: 0  . Years of education: Not on file  . Highest education level: Not on file  Occupational History  . Not on file  Tobacco Use  . Smoking status: Never Smoker  . Smokeless tobacco: Never Used  Substance and Sexual Activity  . Alcohol use: No  . Drug use: Never  . Sexual activity: Not Currently  Other Topics Concern  . Not on file  Social History Narrative  . Not on file   Social Determinants of Health   Financial Resource Strain:   . Difficulty of Paying Living Expenses:   Food Insecurity:   . Worried About Charity fundraiser in the Last Year:   . Arboriculturist in the Last Year:   Transportation Needs:   . Film/video editor (Medical):   Marland Kitchen Lack of Transportation (Non-Medical):   Physical Activity:   . Days of Exercise per Week:   . Minutes of Exercise per Session:   Stress:   . Feeling of Stress :   Social Connections:   . Frequency of Communication with Friends and Family:   . Frequency of Social Gatherings with Friends and Family:   .  Attends Religious Services:   . Active Member of Clubs or Organizations:   . Attends Archivist Meetings:   Marland Kitchen Marital Status:      Family History: The patient's family history includes Mitral valve prolapse in her mother.  ROS:   Please see the history of present illness.    She is otherwise healthy.  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  She feels that she is completely over the COVID-19 infection.  All other systems reviewed and are negative.  EKGs/Labs/Other Studies Reviewed:    The following studies were reviewed today: No cardiac evaluation  EKG:  EKG sinus  rhythm, heart rate 82, overall normal EKG appearance.  Recent Labs: No results found for requested labs within last 8760 hours.  Recent Lipid Panel No results found for: CHOL, TRIG, HDL, CHOLHDL, VLDL, LDLCALC, LDLDIRECT  Physical Exam:    VS:  BP 128/76   Pulse 82   Ht 5' 5.1" (1.654 m)   Wt 162 lb 12.8 oz (73.8 kg)   SpO2 97%   BMI 27.01 kg/m     Wt Readings from Last 3 Encounters:  01/10/20 162 lb 12.8 oz (73.8 kg)  10/22/19 161 lb 1.6 oz (73.1 kg)  11/28/18 163 lb 6.4 oz (74.1 kg)     GEN: Denies complaints and appears healthy, slightly overweight.. No acute distress HEENT: Normal NECK: No JVD. LYMPHATICS: No lymphadenopathy CARDIAC:  RRR without murmur, gallop, or edema. VASCULAR:  Normal Pulses. No bruits. RESPIRATORY:  Clear to auscultation without rales, wheezing or rhonchi  ABDOMEN: Soft, non-tender, non-distended, No pulsatile mass, MUSCULOSKELETAL: No deformity  SKIN: Warm and dry NEUROLOGIC:  Alert and oriented x 3 PSYCHIATRIC:  Normal affect   ASSESSMENT:    1. Palpitations   2. Tremor   3. Anxiety    PLAN:    In order of problems listed above:  1. He has a sign/symptom complex of the above 3 symptoms which include increased heart rate, shaking/tremor, and possibly exacerbation by superimposed anxiety.  Beta-blocker therapy appears to be effective at reducing the awareness of the heartbeat and the tremor.  She is currently on 25 mg metoprolol succinate daily.  I do not believe there is a cardiac issue here.  Perhaps she at times has anxiety with a hyperadrenergic component that leads to tremor and recent heart rate.  At worst, if there is an organic issue it would be neurological.  I have recommended watchful waiting.  If she continues to have symptoms, especially if they include numbness and tingling in a substantial manner, a neurology consultation should be performed.   Medication Adjustments/Labs and Tests Ordered: Current medicines are reviewed at  length with the patient today.  Concerns regarding medicines are outlined above.  Orders Placed This Encounter  Procedures  . EKG 12-Lead   No orders of the defined types were placed in this encounter.   Patient Instructions  Medication Instructions:  Your physician recommends that you continue on your current medications as directed. Please refer to the Current Medication list given to you today.  *If you need a refill on your cardiac medications before your next appointment, please call your pharmacy*   Lab Work: None If you have labs (blood work) drawn today and your tests are completely normal, you will receive your results only by: Marland Kitchen MyChart Message (if you have MyChart) OR . A paper copy in the  mail If you have any lab test that is abnormal or we need to change your treatment, we will call you to review the results.   Testing/Procedures: None   Follow-Up: At [facility], you and your health needs are our priority.  As part of our continuing mission to provide you with exceptional heart care, we have created designated Provider Care Teams.  These Care Teams include your primary Cardiologist (physician) and Advanced Practice Providers (APPs -  Physician Assistants and Nurse Practitioners) who all work together to provide you with the care you need, when you need it.  We recommend signing up for the patient portal called "MyChart".  Sign up information is provided on this After Visit Summary.  MyChart is used to connect with patients for Virtual Visits (Telemedicine).  Patients are able to view lab/test results, encounter notes, upcoming appointments, etc.  Non-urgent messages can be sent to your provider as well.   To learn more about what you can do with MyChart, go to [email or web address].    Your next appointment:   As needed  The format for your next appointment:   In Person  Provider:   You may see Dr. Daneen Schick or one of the following Advanced Practice  Providers on your designated Care Team:    Truitt Merle, NP  Cecilie Kicks, NP  Kathyrn Drown, NP    Other Instructions      Signed, Sinclair Grooms, MD  01/10/2020 3:04 PM    [place]

## 2020-01-10 ENCOUNTER — Encounter: Payer: Self-pay | Admitting: Interventional Cardiology

## 2020-01-10 ENCOUNTER — Ambulatory Visit (INDEPENDENT_AMBULATORY_CARE_PROVIDER_SITE_OTHER): Payer: BC Managed Care – PPO | Admitting: Interventional Cardiology

## 2020-01-10 ENCOUNTER — Other Ambulatory Visit: Payer: Self-pay

## 2020-01-10 VITALS — BP 128/76 | HR 82 | Ht 65.1 in | Wt 162.8 lb

## 2020-01-10 DIAGNOSIS — R251 Tremor, unspecified: Secondary | ICD-10-CM

## 2020-01-10 DIAGNOSIS — R002 Palpitations: Secondary | ICD-10-CM

## 2020-01-10 DIAGNOSIS — F419 Anxiety disorder, unspecified: Secondary | ICD-10-CM

## 2020-01-10 NOTE — Patient Instructions (Signed)

## 2020-02-28 ENCOUNTER — Other Ambulatory Visit: Payer: Self-pay | Admitting: Family Medicine

## 2020-04-04 ENCOUNTER — Other Ambulatory Visit: Payer: Self-pay

## 2020-04-04 ENCOUNTER — Encounter: Payer: Self-pay | Admitting: Family Medicine

## 2020-04-04 ENCOUNTER — Ambulatory Visit (INDEPENDENT_AMBULATORY_CARE_PROVIDER_SITE_OTHER): Payer: BC Managed Care – PPO | Admitting: Family Medicine

## 2020-04-04 VITALS — BP 124/72 | HR 86 | Temp 98.6°F | Ht 65.1 in | Wt 161.3 lb

## 2020-04-04 DIAGNOSIS — J029 Acute pharyngitis, unspecified: Secondary | ICD-10-CM

## 2020-04-04 DIAGNOSIS — H6123 Impacted cerumen, bilateral: Secondary | ICD-10-CM

## 2020-04-04 NOTE — Progress Notes (Signed)
Established Patient Office Visit  Subjective:  Patient ID: Kristen Schwartz, female    DOB: 04-11-1971  Age: 49 y.o. MRN: [id number]  CC:  Chief Complaint  Patient presents with  . Sore Throat    sore throat x 1 week, ears feel stopped up  . Chest congestion    cough started today    HPI Kristen Schwartz presents for sore throat for the past week.  She states about 10 days ago her husband had some respiratory symptoms.  He had Covid testing which came back negative.  She states both ears feel "stopped up ".  She does have a little bit of chest congestion and he started coughing couple days ago.  No fever.  No dyspnea.  No significant myalgias.  She has taken some ibuprofen which helped with her throat symptoms temporarily.  No postnasal drip symptoms.  No sick contacts other than her husband.  She does have history of recurrent cerumen impactions in the past  Past Medical History:  Diagnosis Date  . H/O: pneumonia   . Recurrent UTI   . Uterine fibroid     Past Surgical History:  Procedure Laterality Date  . GALLBLADDER SURGERY    . none      Family History  Problem Relation Age of Onset  . Mitral valve prolapse Mother     Social History   Socioeconomic History  . Marital status: Married    Spouse name: Not on file  . Number of children: 0  . Years of education: Not on file  . Highest education level: Not on file  Occupational History  . Not on file  Tobacco Use  . Smoking status: Never Smoker  . Smokeless tobacco: Never Used  Vaping Use  . Vaping Use: Never used  Substance and Sexual Activity  . Alcohol use: No  . Drug use: Never  . Sexual activity: Not Currently  Other Topics Concern  . Not on file  Social History Narrative  . Not on file   Social Determinants of Health   Financial Resource Strain:   . Difficulty of Paying Living Expenses:   Food Insecurity:   . Worried About Charity fundraiser in the Last Year:   . Arboriculturist in the Last Year:     Transportation Needs:   . Film/video editor (Medical):   Marland Kitchen Lack of Transportation (Non-Medical):   Physical Activity:   . Days of Exercise per Week:   . Minutes of Exercise per Session:   Stress:   . Feeling of Stress :   Social Connections:   . Frequency of Communication with Friends and Family:   . Frequency of Social Gatherings with Friends and Family:   . Attends Religious Services:   . Active Member of Clubs or Organizations:   . Attends Archivist Meetings:   Marland Kitchen Marital Status:   Intimate Partner Violence:   . Fear of Current or Ex-Partner:   . Emotionally Abused:   Marland Kitchen Physically Abused:   . Sexually Abused:     Outpatient Medications Prior to Visit  Medication Sig Dispense Refill  . Lactobacillus (PROBIOTIC ACIDOPHILUS PO) Take 1 tablet by mouth daily.    . metoprolol succinate (TOPROL-XL) 25 MG 24 hr tablet TAKE 1 TABLET BY MOUTH EVERY DAY 30 tablet 2  . mometasone (ELOCON) 0.1 % lotion Apply topically daily. 60 mL 2   No facility-administered medications prior to visit.    Allergies  Allergen  Reactions  . Codeine Sulfate     REACTION: gi upset    ROS Review of Systems  Constitutional: Negative for chills and fever.  HENT: Positive for sore throat. Negative for congestion, sinus pressure and sinus pain.   Respiratory: Positive for cough. Negative for shortness of breath and wheezing.       Objective:    Physical Exam Vitals reviewed.  Constitutional:      Appearance: She is well-developed.  HENT:     Ears:     Comments: Cerumen impactions bilaterally    Mouth/Throat:     Mouth: Mucous membranes are moist.     Pharynx: Oropharynx is clear. No oropharyngeal exudate or posterior oropharyngeal erythema.     Tonsils: No tonsillar exudate.  Cardiovascular:     Rate and Rhythm: Normal rate and regular rhythm.  Pulmonary:     Effort: Pulmonary effort is normal.     Breath sounds: Normal breath sounds.  Musculoskeletal:     Cervical back:  Neck supple.  Lymphadenopathy:     Cervical: No cervical adenopathy.  Neurological:     Mental Status: She is alert.     BP 124/72 (BP Location: Left Arm, Patient Position: Sitting, Cuff Size: Normal)   Pulse 86   Temp 98.6 F (37 C) (Oral)   Ht 5' 5.1" (1.654 m)   Wt 161 lb 5 oz (73.2 kg)   SpO2 96%   BMI 26.76 kg/m  Wt Readings from Last 3 Encounters:  04/04/20 161 lb 5 oz (73.2 kg)  01/10/20 162 lb 12.8 oz (73.8 kg)  10/22/19 161 lb 1.6 oz (73.1 kg)     Health Maintenance Due  Topic Date Due  . Hepatitis C Screening  Never done  . COVID-19 Vaccine (1) Never done  . HIV Screening  Never done  . TETANUS/TDAP  Never done  . PAP SMEAR-Modifier  09/06/2012  . MAMMOGRAM  Never done  . COLONOSCOPY  Never done    There are no preventive care reminders to display for this patient.  Lab Results  Component Value Date   TSH 0.89 11/07/2018   Lab Results  Component Value Date   WBC 8.0 11/07/2018   HGB 13.2 11/07/2018   HCT 39.3 11/07/2018   MCV 94.5 11/07/2018   PLT 330.0 11/07/2018   Lab Results  Component Value Date   NA 140 11/07/2018   K 4.1 11/07/2018   CO2 31 11/07/2018   GLUCOSE 97 11/07/2018   BUN 16 11/07/2018   CREATININE 1.03 11/07/2018   BILITOT 0.9 07/09/2010   ALKPHOS 79 07/09/2010   AST 29 07/09/2010   ALT 19 07/09/2010   PROT 7.2 07/09/2010   ALBUMIN 4.2 07/09/2010   CALCIUM 9.5 11/07/2018   GFR 56.69 (L) 11/07/2018   No results found for: CHOL No results found for: HDL No results found for: LDLCALC No results found for: TRIG No results found for: CHOLHDL No results found for: HGBA1C    Assessment & Plan:   #1 sore throat.  Likelihood of strep seems very low with no fever, no adenopathy, no posterior pharynx erythema or exudate  -Treat symptomatically with ibuprofen or Aleve and salt water gargles  #2 cerumen impactions bilaterally  -We recommend using a curette after reviewing risks including risk of bleeding, pain, low risk of  perforation.  Patient consented.  Using a curette we were able to fully clear the left canal.  Partially cleared the right canal.  Remainder of clearance required irrigation and patient  tolerated well  No orders of the defined types were placed in this encounter.   Follow-up: No follow-ups on file.    Carolann Littler, MD

## 2020-04-28 DIAGNOSIS — H40013 Open angle with borderline findings, low risk, bilateral: Secondary | ICD-10-CM | POA: Diagnosis not present

## 2020-05-16 DIAGNOSIS — H40013 Open angle with borderline findings, low risk, bilateral: Secondary | ICD-10-CM | POA: Diagnosis not present

## 2020-05-23 ENCOUNTER — Encounter: Payer: Self-pay | Admitting: Family Medicine

## 2020-05-26 NOTE — Telephone Encounter (Signed)
Please advise 

## 2020-06-22 ENCOUNTER — Other Ambulatory Visit: Payer: Self-pay | Admitting: Family Medicine

## 2021-01-02 DIAGNOSIS — H40013 Open angle with borderline findings, low risk, bilateral: Secondary | ICD-10-CM | POA: Diagnosis not present

## 2021-01-13 ENCOUNTER — Encounter: Payer: Self-pay | Admitting: Family Medicine

## 2021-01-13 MED ORDER — METOPROLOL SUCCINATE ER 25 MG PO TB24: 1.0000 | ORAL_TABLET | Freq: Every day | ORAL | 0 refills | Status: DC

## 2021-03-13 ENCOUNTER — Telehealth: Payer: Self-pay

## 2021-03-13 MED ORDER — METOPROLOL SUCCINATE ER 25 MG PO TB24: 25.0000 mg | ORAL_TABLET | Freq: Every day | ORAL | 0 refills | Status: DC

## 2021-03-13 NOTE — Telephone Encounter (Signed)
Refill sent.

## 2021-04-17 ENCOUNTER — Telehealth: Payer: Self-pay | Admitting: Family Medicine

## 2021-04-17 NOTE — Telephone Encounter (Signed)
Last labs- 11/07/2018

## 2021-04-17 NOTE — Telephone Encounter (Signed)
Lvm with below message from Dr. Elease Hashimoto

## 2021-04-17 NOTE — Telephone Encounter (Signed)
PT would like to get some bloodwork done when she comes into the office for her apt on August 16 to check to make sure she is not deficient in anything.

## 2021-04-20 ENCOUNTER — Other Ambulatory Visit: Payer: Self-pay

## 2021-04-21 ENCOUNTER — Ambulatory Visit (INDEPENDENT_AMBULATORY_CARE_PROVIDER_SITE_OTHER): Payer: BC Managed Care – PPO | Admitting: Family Medicine

## 2021-04-21 ENCOUNTER — Other Ambulatory Visit: Payer: Self-pay | Admitting: Family Medicine

## 2021-04-21 ENCOUNTER — Encounter: Payer: Self-pay | Admitting: Family Medicine

## 2021-04-21 VITALS — BP 130/70 | HR 70 | Temp 98.0°F | Wt 127.9 lb

## 2021-04-21 DIAGNOSIS — R519 Headache, unspecified: Secondary | ICD-10-CM | POA: Diagnosis not present

## 2021-04-21 MED ORDER — AMOXICILLIN-POT CLAVULANATE 875-125 MG PO TABS: 1.0000 | ORAL_TABLET | Freq: Two times a day (BID) | ORAL | 0 refills | Status: DC

## 2021-04-21 NOTE — Patient Instructions (Signed)
Set up complete physical soon  Let me know if headache not clearing in the next 10 days.

## 2021-04-21 NOTE — Progress Notes (Signed)
Established Patient Office Visit  Subjective:  Patient ID: Kristen Schwartz, female    DOB: 04-May-1971  Age: 50 y.o. MRN: [id number]  CC:  Chief Complaint  Patient presents with   Headache    Pt complains of headaches when bending down or when she sneezes, pain across the forehead, x 4 weeks    HPI GIARA ZORA presents for approximately 4-week history of bilateral frontal headache.  Pain somewhat worse with bending down and when sneezing.  No bloody or purulent nasal secretions.  No fever.  No generalized headache.  No exertional headache.  Denies any maxillary facial pain.  Headache can last several minutes to several hours.  Not progressive over the past 4 weeks.  Patient has lost substantial weight since last visit here.  She states she is doing type of high-protein low-carb diet.  She has good appetite and feels well overall.  Her weight loss has stabilized and she is trying to just maintain her weight loss at this point.  Past Medical History:  Diagnosis Date   H/O: pneumonia    Recurrent UTI    Uterine fibroid     Past Surgical History:  Procedure Laterality Date   GALLBLADDER SURGERY     none      Family History  Problem Relation Age of Onset   Mitral valve prolapse Mother     Social History   Socioeconomic History   Marital status: Married    Spouse name: Not on file   Number of children: 0   Years of education: Not on file   Highest education level: Not on file  Occupational History   Not on file  Tobacco Use   Smoking status: Never   Smokeless tobacco: Never  Vaping Use   Vaping Use: Never used  Substance and Sexual Activity   Alcohol use: No   Drug use: Never   Sexual activity: Not Currently  Other Topics Concern   Not on file  Social History Narrative   Not on file   Social Determinants of Health   Financial Resource Strain: Not on file  Food Insecurity: Not on file  Transportation Needs: Not on file  Physical Activity: Not on file  Stress:  Not on file  Social Connections: Not on file  Intimate Partner Violence: Not on file    Outpatient Medications Prior to Visit  Medication Sig Dispense Refill   Lactobacillus (PROBIOTIC ACIDOPHILUS PO) Take 1 tablet by mouth daily.     metoprolol succinate (TOPROL-XL) 25 MG 24 hr tablet Take 1 tablet (25 mg total) by mouth daily. 30 tablet 0   mometasone (ELOCON) 0.1 % lotion Apply topically daily. 60 mL 2   No facility-administered medications prior to visit.    Allergies  Allergen Reactions   Codeine Sulfate     REACTION: gi upset    ROS Review of Systems  Constitutional:  Negative for chills and fever.  HENT:  Positive for sinus pressure and sinus pain. Negative for congestion and sore throat.   Eyes:  Negative for visual disturbance.  Respiratory:  Negative for cough and shortness of breath.   Neurological:  Positive for headaches. Negative for seizures, syncope, speech difficulty and weakness.     Objective:    Physical Exam Vitals reviewed.  Constitutional:      General: She is not in acute distress.    Appearance: She is well-developed. She is not ill-appearing.  Cardiovascular:     Rate and Rhythm: Normal rate and  regular rhythm.  Pulmonary:     Effort: Pulmonary effort is normal.     Breath sounds: Normal breath sounds.  Musculoskeletal:     Cervical back: No rigidity.  Lymphadenopathy:     Cervical: No cervical adenopathy.  Neurological:     Mental Status: She is alert.    BP 130/70 (BP Location: Left Arm, Patient Position: Sitting, Cuff Size: Normal)   Pulse 70   Temp 98 F (36.7 C) (Oral)   Wt 127 lb 14.4 oz (58 kg)   SpO2 99%   BMI 21.22 kg/m  Wt Readings from Last 3 Encounters:  04/21/21 127 lb 14.4 oz (58 kg)  04/04/20 161 lb 5 oz (73.2 kg)  01/10/20 162 lb 12.8 oz (73.8 kg)     Health Maintenance Due  Topic Date Due   COVID-19 Vaccine (1) Never done   HIV Screening  Never done   Hepatitis C Screening  Never done   TETANUS/TDAP  Never  done   PAP SMEAR-Modifier  09/06/2012   COLONOSCOPY (Pts 45-1yr Insurance coverage will need to be confirmed)  Never done   MAMMOGRAM  Never done   Zoster Vaccines- Shingrix (1 of 2) Never done    There are no preventive care reminders to display for this patient.  Lab Results  Component Value Date   TSH 0.89 11/07/2018   Lab Results  Component Value Date   WBC 8.0 11/07/2018   HGB 13.2 11/07/2018   HCT 39.3 11/07/2018   MCV 94.5 11/07/2018   PLT 330.0 11/07/2018   Lab Results  Component Value Date   NA 140 11/07/2018   K 4.1 11/07/2018   CO2 31 11/07/2018   GLUCOSE 97 11/07/2018   BUN 16 11/07/2018   CREATININE 1.03 11/07/2018   BILITOT 0.9 07/09/2010   ALKPHOS 79 07/09/2010   AST 29 07/09/2010   ALT 19 07/09/2010   PROT 7.2 07/09/2010   ALBUMIN 4.2 07/09/2010   CALCIUM 9.5 11/07/2018   GFR 56.69 (L) 11/07/2018   No results found for: CHOL No results found for: HDL No results found for: LDLCALC No results found for: TRIG No results found for: CHOLHDL No results found for: HGBA1C    Assessment & Plan:   1 month history of bifrontal sinus pressure and pain.  Not clear if this is acute sinusitis but we recommended going ahead and treating with Augmentin 875 mg twice daily for 10 days.  Be in touch if symptoms not clearing with antibiotics.  We have also suggest that she set up complete physical soon and she plans to do so.  Meds ordered this encounter  Medications   amoxicillin-clavulanate (AUGMENTIN) 875-125 MG tablet    Sig: Take 1 tablet by mouth 2 (two) times daily.    Dispense:  20 tablet    Refill:  0    Follow-up: No follow-ups on file.    BCarolann Littler MD

## 2021-04-23 ENCOUNTER — Emergency Department (HOSPITAL_COMMUNITY): Payer: BC Managed Care – PPO

## 2021-04-23 ENCOUNTER — Encounter (HOSPITAL_COMMUNITY): Payer: Self-pay

## 2021-04-23 ENCOUNTER — Other Ambulatory Visit: Payer: Self-pay

## 2021-04-23 ENCOUNTER — Emergency Department (HOSPITAL_COMMUNITY)
Admission: EM | Admit: 2021-04-23 | Discharge: 2021-04-23 | Disposition: A | Discharge status: Home / Self Care | Payer: BC Managed Care – PPO | Attending: Emergency Medicine | Admitting: Emergency Medicine

## 2021-04-23 DIAGNOSIS — K219 Gastro-esophageal reflux disease without esophagitis: Secondary | ICD-10-CM | POA: Diagnosis not present

## 2021-04-23 DIAGNOSIS — R9431 Abnormal electrocardiogram [ECG] [EKG]: Secondary | ICD-10-CM | POA: Diagnosis not present

## 2021-04-23 DIAGNOSIS — R945 Abnormal results of liver function studies: Secondary | ICD-10-CM | POA: Diagnosis not present

## 2021-04-23 DIAGNOSIS — R109 Unspecified abdominal pain: Secondary | ICD-10-CM | POA: Diagnosis not present

## 2021-04-23 DIAGNOSIS — R1012 Left upper quadrant pain: Secondary | ICD-10-CM | POA: Diagnosis not present

## 2021-04-23 DIAGNOSIS — R7401 Elevation of levels of liver transaminase levels: Secondary | ICD-10-CM | POA: Diagnosis not present

## 2021-04-23 LAB — URINALYSIS, ROUTINE W REFLEX MICROSCOPIC
Bacteria, UA: NONE SEEN
Bilirubin Urine: NEGATIVE
Glucose, UA: NEGATIVE mg/dL
Hgb urine dipstick: NEGATIVE
Ketones, ur: NEGATIVE mg/dL
Nitrite: NEGATIVE
Protein, ur: NEGATIVE mg/dL
Specific Gravity, Urine: 1.005 (ref 1.005–1.030)
pH: 7 (ref 5.0–8.0)

## 2021-04-23 LAB — COMPREHENSIVE METABOLIC PANEL
ALT: 169 U/L — ABNORMAL HIGH (ref 0–44)
AST: 296 U/L — ABNORMAL HIGH (ref 15–41)
Albumin: 5.1 g/dL — ABNORMAL HIGH (ref 3.5–5.0)
Alkaline Phosphatase: 113 U/L (ref 38–126)
Anion gap: 9 (ref 5–15)
BUN: 20 mg/dL (ref 6–20)
CO2: 28 mmol/L (ref 22–32)
Calcium: 9.9 mg/dL (ref 8.9–10.3)
Chloride: 105 mmol/L (ref 98–111)
Creatinine, Ser: 0.78 mg/dL (ref 0.44–1.00)
GFR, Estimated: >60 mL/min (ref >60)
Glucose, Bld: 105 mg/dL — ABNORMAL HIGH (ref 70–99)
Potassium: 4 mmol/L (ref 3.5–5.1)
Sodium: 142 mmol/L (ref 135–145)
Total Bilirubin: 1.1 mg/dL (ref 0.3–1.2)
Total Protein: 8.1 g/dL (ref 6.5–8.1)

## 2021-04-23 LAB — HEPATITIS PANEL, ACUTE
HCV Ab: NONREACTIVE
Hep A IgM: NONREACTIVE
Hep B C IgM: NONREACTIVE
Hepatitis B Surface Ag: NONREACTIVE

## 2021-04-23 LAB — CBC
HCT: 43.9 % (ref 36.0–46.0)
Hemoglobin: 15.2 g/dL — ABNORMAL HIGH (ref 12.0–15.0)
MCH: 33 pg (ref 26.0–34.0)
MCHC: 34.6 g/dL (ref 30.0–36.0)
MCV: 95.2 fL (ref 80.0–100.0)
Platelets: 281 10*3/uL (ref 150–400)
RBC: 4.61 MIL/uL (ref 3.87–5.11)
RDW: 12.5 % (ref 11.5–15.5)
WBC: 7.2 10*3/uL (ref 4.0–10.5)
nRBC: 0 % (ref 0.0–0.2)

## 2021-04-23 LAB — ACETAMINOPHEN LEVEL: Acetaminophen (Tylenol), Serum: <10 ug/mL — ABNORMAL LOW (ref 10–30)

## 2021-04-23 LAB — LIPASE, BLOOD: Lipase: 50 U/L (ref 11–51)

## 2021-04-23 MED ORDER — IOHEXOL 350 MG/ML SOLN
80.0000 mL | Freq: Once | INTRAVENOUS | Status: AC | PRN
  Administered 2021-04-23: 80 mL via INTRAVENOUS

## 2021-04-23 NOTE — ED Provider Notes (Signed)
Patient taken in sign out from [place].  Patient c/o abd pain on the left that radiates around to her back.  S/p cholecystitis.  Notable elevated LFTs.- no values to compare. Currently awaiting Hepatic fx and tylenol level.  UA is negative.  Findings and case discussed at length with the patient. I did order an EKG as patient was concerned her pain could have been chest related.  ECG interpretation   Date: 04/24/2021  Rate: 79  Rhythm: normal sinus rhythm  QRS Axis: normal  Intervals: normal  ST/T Wave abnormalities: normal  Conduction Disutrbances: none  Narrative Interpretation: NSR   I reviewed the patient's work up. Her hepatitis panel is pending. Will need f/u with pcp and repeat LFTs. Patient otherwise appropriate for dc/ all questions answered to the best of my ability.       Margarita Mail, PA-C 04/24/21 [place], DO 04/24/21 1239

## 2021-04-23 NOTE — ED Provider Notes (Signed)
[facility] DEPT Provider Note   CSN: [id number] Arrival date & time: 04/23/21  1219     History Chief Complaint  Patient presents with   Abdominal Pain    Kristen Schwartz is a 50 y.o. female.  The history is provided by the patient. No language interpreter was used.  Abdominal Pain  50 year old female presenting valuation of abdominal pain.  Patient developed acute onset of pain to her left abdomen that started a few hours ago.  She described pain as a sharp crampy sensation that started in her left upper abdomen and radiates towards back and wraps around the other side of her abdomen.  Pain would last for a few minutes, resolved, returns again and resolved for total of 3 separate episodes.  Pain has since subsided.  Pain is not associated with fever chills productive cough chest pain shortness of breath lightheadedness dizziness nausea vomiting diarrhea or constipation.  No dysuria.  Denies any blood in the urine.  She has never had this kind of pain before.  She denies any specific treatment tried.  No recent heavy lifting strength activities new exercise routine.  She denies tobacco or alcohol use.  She has had prior cholecystectomy.  She has not had a colonoscopy.  Patient have not ate lunch yet  Past Medical History:  Diagnosis Date   H/O: pneumonia    Recurrent UTI    Uterine fibroid     Patient Active Problem List   Diagnosis Date Noted   MIGRAINE HEADACHE 09/17/2010   GERD 09/17/2010   PARESTHESIA 09/17/2010   ABDOMINAL PAIN 01/24/2007    Past Surgical History:  Procedure Laterality Date   GALLBLADDER SURGERY     none       OB History   No obstetric history on file.     Family History  Problem Relation Age of Onset   Mitral valve prolapse Mother     Social History   Tobacco Use   Smoking status: Never   Smokeless tobacco: Never  Vaping Use   Vaping Use: Never used  Substance Use Topics   Alcohol use: No   Drug use: Never     Home Medications Prior to Admission medications   Medication Sig Start Date End Date Taking? Authorizing Provider  amoxicillin-clavulanate (AUGMENTIN) 875-125 MG tablet Take 1 tablet by mouth 2 (two) times daily. 04/21/21   Burchette, Alinda Sierras, MD  Lactobacillus (PROBIOTIC ACIDOPHILUS PO) Take 1 tablet by mouth daily.    [provider]  metoprolol succinate (TOPROL-XL) 25 MG 24 hr tablet TAKE 1 TABLET (25 MG TOTAL) BY MOUTH DAILY. 04/22/21   Burchette, Alinda Sierras, MD  mometasone (ELOCON) 0.1 % lotion Apply topically daily. 02/01/18   Burchette, Alinda Sierras, MD    Allergies    Codeine sulfate  Review of Systems   Review of Systems  Gastrointestinal:  Positive for abdominal pain.  All other systems reviewed and are negative.  Physical Exam Updated Vital Signs BP 130/89 (BP Location: Left Arm)   Pulse 75   Temp 97.8 F (36.6 C) (Oral)   Resp 16   Ht 5' 5.5" (1.664 m)   Wt 57.6 kg   SpO2 100%   BMI 20.81 kg/m   Physical Exam Vitals and nursing note reviewed.  Constitutional:      General: She is not in acute distress.    Appearance: She is well-developed.  HENT:     Head: Atraumatic.  Eyes:     Conjunctiva/sclera: Conjunctivae  normal.  Cardiovascular:     Rate and Rhythm: Normal rate and regular rhythm.     Heart sounds: Normal heart sounds.  Pulmonary:     Effort: Pulmonary effort is normal.  Abdominal:     General: Abdomen is flat. Bowel sounds are normal. There is no distension.     Palpations: Abdomen is soft.     Tenderness: There is no abdominal tenderness. There is no right CVA tenderness or left CVA tenderness.  Musculoskeletal:     Cervical back: Neck supple.  Skin:    Findings: No rash.  Neurological:     Mental Status: She is alert.  Psychiatric:        Mood and Affect: Mood normal.    ED Results / Procedures / Treatments   Labs (all labs ordered are listed, but only abnormal results are displayed) Labs Reviewed  COMPREHENSIVE METABOLIC PANEL  - Abnormal; Notable for the following components:      Result Value   Glucose, Bld 105 (*)    Albumin 5.1 (*)    AST 296 (*)    ALT 169 (*)    All other components within normal limits  CBC - Abnormal; Notable for the following components:   Hemoglobin 15.2 (*)    All other components within normal limits  LIPASE, BLOOD  URINALYSIS, ROUTINE W REFLEX MICROSCOPIC  ACETAMINOPHEN LEVEL  HEPATITIS PANEL, ACUTE    EKG None  Radiology No results found.  Procedures Procedures   Medications Ordered in ED Medications - No data to display  ED Course  I have reviewed the triage vital signs and the nursing notes.  Pertinent labs & imaging results that were available during my care of the patient were reviewed by me and considered in my medical decision making (see chart for details).    MDM Rules/Calculators/A&P                           BP 111/70   Pulse 68   Temp 97.8 F (36.6 C) (Oral)   Resp 16   Ht 5' 5.5" (1.664 m)   Wt 57.6 kg   SpO2 99%   BMI 20.81 kg/m   Final Clinical Impression(s) / ED Diagnoses Final diagnoses:  Left upper quadrant abdominal pain  Transaminitis    Rx / DC Orders ED Discharge Orders     None      1:08 PM Patient report having intermittent left-sided abdominal pain that started earlier today.  Pain is since resolved.  At this time she does not have any reproducible abdominal pain and no CVA tenderness.  She is overall generally healthy.  No cardiopulmonary complaint.  2:35 PM Labs remarkable for transaminitis with AST 296, ALT 169 and normal total bili of 1.1.  Patient has had prior cholecystectomy approximately 12 years ago.On reexam she does not have any reproducible abdominal tenderness.  She denies regular Tylenol use, alcohol or drug use and no history of hepatitis.  Given this finding, will obtain abdominal pelvis CT scan for further evaluation.  I will also check Tylenol level as well as hepatitis panel.  Care discussed with Dr.  Alvino Chapel.   3:10 PM Patient signed out to oncoming provider who will follow up on CT result as well as hepatitis panel and Tylenol level.   Domenic Moras, PA-C 04/23/21 1511    Davonna Belling, MD 04/24/21 [phone]

## 2021-04-23 NOTE — Discharge Instructions (Addendum)
Your EKG showed no significant abnormalities.Please follow up with your primary care doctor about your liver enzymes and have repeat testing in 7-10 days.  Abdominal (belly) pain can be caused by many things. Your caregiver performed an examination and possibly ordered blood/urine tests and imaging (CT scan, x-rays, ultrasound). Many cases can be observed and treated at home after initial evaluation in the emergency department. Even though you are being discharged home, abdominal pain can be unpredictable. Therefore, you need a repeated exam if your pain does not resolve, returns, or worsens. Most patients with abdominal pain don't have to be admitted to the hospital or have surgery, but serious problems like appendicitis and gallbladder attacks can start out as nonspecific pain. Many abdominal conditions cannot be diagnosed in one visit, so follow-up evaluations are very important.  SEEK IMMEDIATE MEDICAL ATTENTION IF: The pain does not go away or becomes severe.  A temperature above 101 develops.  Repeated vomiting occurs (multiple episodes).  The pain becomes localized to portions of the abdomen. The right side could possibly be appendicitis. In an adult, the left lower portion of the abdomen could be colitis or diverticulitis.  Blood is being passed in stools or vomit (bright red or black tarry stools).  Return also if you develop chest pain, difficulty breathing, dizziness or fainting, or become confused, poorly responsive, or inconsolable (young children).

## 2021-04-23 NOTE — ED Triage Notes (Signed)
Patient reports a sudden onset of LUQ  cramping that radiated across the abdomen and into the back. Patient states she had 3 episodes of the same, but now there is no pain. Patient denies any N/v/D.

## 2021-04-27 ENCOUNTER — Other Ambulatory Visit: Payer: Self-pay

## 2021-04-28 ENCOUNTER — Encounter: Payer: Self-pay | Admitting: Family Medicine

## 2021-04-28 ENCOUNTER — Other Ambulatory Visit (HOSPITAL_COMMUNITY)
Admission: RE | Admit: 2021-04-28 | Discharge: 2021-04-28 | Disposition: A | Discharge status: Home / Self Care | Payer: BC Managed Care – PPO | Source: Ambulatory Visit | Attending: Family Medicine | Admitting: Family Medicine

## 2021-04-28 ENCOUNTER — Ambulatory Visit (INDEPENDENT_AMBULATORY_CARE_PROVIDER_SITE_OTHER): Payer: BC Managed Care – PPO | Admitting: Family Medicine

## 2021-04-28 VITALS — BP 120/70 | HR 82 | Temp 97.8°F | Ht 65.5 in | Wt 125.9 lb

## 2021-04-28 DIAGNOSIS — R7401 Elevation of levels of liver transaminase levels: Secondary | ICD-10-CM | POA: Diagnosis not present

## 2021-04-28 DIAGNOSIS — Z23 Encounter for immunization: Secondary | ICD-10-CM | POA: Diagnosis not present

## 2021-04-28 DIAGNOSIS — Z0000 Encounter for general adult medical examination without abnormal findings: Secondary | ICD-10-CM

## 2021-04-28 LAB — CBC WITH DIFFERENTIAL/PLATELET
Basophils Absolute: 0 10*3/uL (ref 0.0–0.1)
Basophils Relative: 0.8 % (ref 0.0–3.0)
Eosinophils Absolute: 0.1 10*3/uL (ref 0.0–0.7)
Eosinophils Relative: 2.5 % (ref 0.0–5.0)
HCT: 39 % (ref 36.0–46.0)
Hemoglobin: 13.1 g/dL (ref 12.0–15.0)
Lymphocytes Relative: 37.6 % (ref 12.0–46.0)
Lymphs Abs: 2.2 10*3/uL (ref 0.7–4.0)
MCHC: 33.7 g/dL (ref 30.0–36.0)
MCV: 95.6 fl (ref 78.0–100.0)
Monocytes Absolute: 0.3 10*3/uL (ref 0.1–1.0)
Monocytes Relative: 5.2 % (ref 3.0–12.0)
Neutro Abs: 3.2 10*3/uL (ref 1.4–7.7)
Neutrophils Relative %: 53.9 % (ref 43.0–77.0)
Platelets: 276 10*3/uL (ref 150.0–400.0)
RBC: 4.08 Mil/uL (ref 3.87–5.11)
RDW: 12.9 % (ref 11.5–15.5)
WBC: 5.9 10*3/uL (ref 4.0–10.5)

## 2021-04-28 LAB — LIPID PANEL
Cholesterol: 163 mg/dL (ref 0–200)
HDL: 55.5 mg/dL (ref >39.00)
LDL Cholesterol: 86 mg/dL (ref 0–99)
NonHDL: 107.54
Total CHOL/HDL Ratio: 3
Triglycerides: 107 mg/dL (ref 0.0–149.0)
VLDL: 21.4 mg/dL (ref 0.0–40.0)

## 2021-04-28 LAB — TSH: TSH: 1.29 u[IU]/mL (ref 0.35–5.50)

## 2021-04-28 LAB — HEPATIC FUNCTION PANEL
ALT: 42 U/L — ABNORMAL HIGH (ref 0–35)
AST: 23 U/L (ref 0–37)
Albumin: 4 g/dL (ref 3.5–5.2)
Alkaline Phosphatase: 94 U/L (ref 39–117)
Bilirubin, Direct: 0.1 mg/dL (ref 0.0–0.3)
Total Bilirubin: 0.7 mg/dL (ref 0.2–1.2)
Total Protein: 6.4 g/dL (ref 6.0–8.3)

## 2021-04-28 LAB — BASIC METABOLIC PANEL
BUN: 15 mg/dL (ref 6–23)
CO2: 30 mEq/L (ref 19–32)
Calcium: 9.3 mg/dL (ref 8.4–10.5)
Chloride: 101 mEq/L (ref 96–112)
Creatinine, Ser: 0.86 mg/dL (ref 0.40–1.20)
GFR: 77.58 mL/min (ref >60.00)
Glucose, Bld: 79 mg/dL (ref 70–99)
Potassium: 4 mEq/L (ref 3.5–5.1)
Sodium: 140 mEq/L (ref 135–145)

## 2021-04-28 LAB — FERRITIN: Ferritin: 98.2 ng/mL (ref 10.0–291.0)

## 2021-04-28 NOTE — Patient Instructions (Addendum)
Set up mammogram  Consider Shingrix vaccine  We will set up GI referral for colonoscopy  Tetanus given today  We will call with lab results.

## 2021-04-28 NOTE — Progress Notes (Signed)
Established Patient Office Visit  Subjective:  Patient ID: Kristen Schwartz, female    DOB: February 26, 1971  Age: 50 y.o. MRN: [id number]  CC:  Chief Complaint  Patient presents with   Annual Exam    No new concerns     HPI Kristen Schwartz presents for physical exam.  She has not had a physical now in several years.  She had presented recently with some frontal headache and we started Augmentin and she has not seen any improvement thus far.  She was seen in the ER just 5 days ago with some left upper quadrant abdominal pain.  CT abdomen pelvis showed no acute abnormalities.  She did have fairly high liver transaminases.  She does not use any alcohol.  CT scan did not show any significant liver abnormalities.  Alkaline phosphatase and bilirubin levels were normal.  She had previous cholecystectomy.  Denies any prior history of liver problems.  No acetaminophen use.  Does take some vitamins but no herbal supplements.  No known family history of hemochromatosis.   Infectious hepatitis studies were normal  Health maintenance reviewed:  -Recent hepatitis C screen negative -Last tetanus over 10 years ago -No history of shingles vaccine -Last mammogram over 10 years ago. -Has never had screening colonoscopy -Last Pap smear was several years ago possibly as long as 10 years.  Family history-mother has multiple myeloma.  Her mother reportedly has mitral valve prolapse.  No family history of type 2 diabetes or premature heart disease.  Social history-she is married and has 79 year old daughter.  Never smoked.  No alcohol.  No illicit drug use.  Past Medical History:  Diagnosis Date   H/O: pneumonia    Recurrent UTI    Uterine fibroid     Past Surgical History:  Procedure Laterality Date   GALLBLADDER SURGERY     none      Family History  Problem Relation Age of Onset   Mitral valve prolapse Mother     Social History   Socioeconomic History   Marital status: Married    Spouse name:  Not on file   Number of children: 0   Years of education: Not on file   Highest education level: Not on file  Occupational History   Not on file  Tobacco Use   Smoking status: Never   Smokeless tobacco: Never  Vaping Use   Vaping Use: Never used  Substance and Sexual Activity   Alcohol use: No   Drug use: Never   Sexual activity: Not Currently  Other Topics Concern   Not on file  Social History Narrative   Not on file   Social Determinants of Health   Financial Resource Strain: Not on file  Food Insecurity: Not on file  Transportation Needs: Not on file  Physical Activity: Not on file  Stress: Not on file  Social Connections: Not on file  Intimate Partner Violence: Not on file    Outpatient Medications Prior to Visit  Medication Sig Dispense Refill   amoxicillin-clavulanate (AUGMENTIN) 875-125 MG tablet Take 1 tablet by mouth 2 (two) times daily. 20 tablet 0   aspirin EC 325 MG tablet Take 325 mg by mouth every 6 (six) hours as needed for mild pain.     metoprolol succinate (TOPROL-XL) 25 MG 24 hr tablet TAKE 1 TABLET (25 MG TOTAL) BY MOUTH DAILY. 30 tablet 0   Multiple Vitamin (MULTIVITAMIN WITH MINERALS) TABS tablet Take 1 tablet by mouth daily.  trolamine salicylate (ASPERCREME) 10 % cream Apply 1 application topically as needed for muscle pain.     No facility-administered medications prior to visit.    Allergies  Allergen Reactions   Codeine Sulfate     REACTION: gi upset    ROS Review of Systems  Constitutional:  Negative for activity change, appetite change, fatigue, fever and unexpected weight change.  HENT:  Negative for ear pain, hearing loss, sore throat and trouble swallowing.   Eyes:  Negative for visual disturbance.  Respiratory:  Negative for cough and shortness of breath.   Cardiovascular:  Negative for chest pain and palpitations.  Gastrointestinal:  Negative for abdominal distention, blood in stool, constipation, diarrhea, nausea and  vomiting.  Endocrine: Negative for polydipsia and polyuria.  Genitourinary:  Negative for dysuria and hematuria.  Musculoskeletal:  Negative for arthralgias, back pain and myalgias.  Skin:  Negative for rash.  Neurological:  Negative for dizziness, syncope and headaches.  Hematological:  Negative for adenopathy.  Psychiatric/Behavioral:  Negative for confusion and dysphoric mood.      Objective:    Physical Exam Constitutional:      Appearance: She is well-developed.  HENT:     Head: Normocephalic and atraumatic.  Eyes:     Pupils: Pupils are equal, round, and reactive to light.  Neck:     Thyroid: No thyromegaly.  Cardiovascular:     Rate and Rhythm: Normal rate and regular rhythm.     Heart sounds: Normal heart sounds. No murmur heard. Pulmonary:     Effort: No respiratory distress.     Breath sounds: Normal breath sounds. No wheezing or rales.  Abdominal:     General: Bowel sounds are normal. There is no distension.     Palpations: Abdomen is soft. There is no mass.     Tenderness: There is no abdominal tenderness. There is no guarding or rebound.  Genitourinary:    Comments: Breasts are symmetric with no masses.  No nipple inversion.  No skin dimpling.  Pelvic exam normal external genitalia.  Vaginal mucosa somewhat atrophic.  Cervix no gross abnormalities.  Pap smear obtained.  Bimanual exam no tenderness.  No masses. Musculoskeletal:        General: Normal range of motion.     Cervical back: Normal range of motion and neck supple.  Lymphadenopathy:     Cervical: No cervical adenopathy.  Skin:    Findings: No rash.  Neurological:     Mental Status: She is alert and oriented to person, place, and time.     Cranial Nerves: No cranial nerve deficit.     Deep Tendon Reflexes: Reflexes normal.  Psychiatric:        Behavior: Behavior normal.        Thought Content: Thought content normal.        Judgment: Judgment normal.    BP 120/70 (BP Location: Left Arm, Patient  Position: Sitting, Cuff Size: Normal)   Pulse 82   Temp 97.8 F (36.6 C) (Oral)   Ht 5' 5.5" (1.664 m)   Wt 125 lb 14.4 oz (57.1 kg)   SpO2 98%   BMI 20.63 kg/m  Wt Readings from Last 3 Encounters:  04/28/21 125 lb 14.4 oz (57.1 kg)  04/23/21 127 lb (57.6 kg)  04/21/21 127 lb 14.4 oz (58 kg)     Health Maintenance Due  Topic Date Due   COVID-19 Vaccine (1) Never done   HIV Screening  Never done   PAP SMEAR-Modifier  09/06/2012  COLONOSCOPY (Pts 45-40yrs Insurance coverage will need to be confirmed)  Never done   MAMMOGRAM  Never done   Zoster Vaccines- Shingrix (1 of 2) Never done    There are no preventive care reminders to display for this patient.  Lab Results  Component Value Date   TSH 0.89 11/07/2018   Lab Results  Component Value Date   WBC 7.2 04/23/2021   HGB 15.2 (H) 04/23/2021   HCT 43.9 04/23/2021   MCV 95.2 04/23/2021   PLT 281 04/23/2021   Lab Results  Component Value Date   NA 142 04/23/2021   K 4.0 04/23/2021   CO2 28 04/23/2021   GLUCOSE 105 (H) 04/23/2021   BUN 20 04/23/2021   CREATININE 0.78 04/23/2021   BILITOT 1.1 04/23/2021   ALKPHOS 113 04/23/2021   AST 296 (H) 04/23/2021   ALT 169 (H) 04/23/2021   PROT 8.1 04/23/2021   ALBUMIN 5.1 (H) 04/23/2021   CALCIUM 9.9 04/23/2021   ANIONGAP 9 04/23/2021   GFR 56.69 (L) 11/07/2018   No results found for: CHOL No results found for: HDL No results found for: LDLCALC No results found for: TRIG No results found for: CHOLHDL No results found for: HGBA1C    Assessment & Plan:   Problem List Items Addressed This Visit   None Visit Diagnoses     Physical exam    -  Primary   Relevant Orders   Basic metabolic panel   Lipid panel   CBC with Differential/Platelet   TSH   Hepatic function panel   Ferritin   Iron and TIBC   PAP []   Elevation of levels of liver transaminase levels       Relevant Orders   Ferritin   Iron and TIBC   Need for Tdap vaccination        Relevant Orders   Tdap vaccine greater than or equal to 7yo IM (Completed)     Several health maintenance issues addressed as below  -Consider annual flu vaccine -Set up mammogram.  She was given information for the breast center -We are setting up referral to GI for colonoscopy -Tdap given -Pap smear obtained. -We did discuss possible use of topical estrogens for her atrophic vaginal mucosa but she is not interested at this time -Consider Shingrix vaccine and she will check on insurance coverage -Obtain screening labs as above.  We will add ferritin and transferrin saturation with recent elevated liver transaminases. -If liver enzymes not coming down consider further evaluation for autoimmune causes and other potential etiologies.  Recent infectious hepatitis studies negative and imaging of liver unremarkable.  No orders of the defined types were placed in this encounter.   Follow-up: No follow-ups on file.    Kristen Littler, MD

## 2021-04-29 ENCOUNTER — Other Ambulatory Visit: Payer: Self-pay

## 2021-04-29 DIAGNOSIS — R7401 Elevation of levels of liver transaminase levels: Secondary | ICD-10-CM

## 2021-04-29 LAB — CYTOLOGY - PAP: Diagnosis: NEGATIVE

## 2021-04-30 LAB — IRON AND TIBC
Iron Saturation: 52 % (ref 15–55)
Iron: 147 ug/dL (ref 27–159)
Total Iron Binding Capacity: 281 ug/dL (ref 250–450)
UIBC: 134 ug/dL (ref 131–425)

## 2021-05-06 DIAGNOSIS — H40013 Open angle with borderline findings, low risk, bilateral: Secondary | ICD-10-CM | POA: Diagnosis not present

## 2021-06-05 ENCOUNTER — Other Ambulatory Visit: Payer: Self-pay | Admitting: Family Medicine

## 2021-09-07 ENCOUNTER — Other Ambulatory Visit: Payer: Self-pay | Admitting: Family Medicine

## 2022-02-08 ENCOUNTER — Encounter: Payer: Self-pay | Admitting: Family Medicine

## 2022-02-08 ENCOUNTER — Ambulatory Visit: Payer: BC Managed Care – PPO | Admitting: Family Medicine

## 2022-02-08 ENCOUNTER — Ambulatory Visit (INDEPENDENT_AMBULATORY_CARE_PROVIDER_SITE_OTHER): Payer: BC Managed Care – PPO | Admitting: Family Medicine

## 2022-02-08 VITALS — BP 110/68 | HR 58 | Temp 97.9°F | Ht 65.5 in | Wt 132.8 lb

## 2022-02-08 DIAGNOSIS — L089 Local infection of the skin and subcutaneous tissue, unspecified: Secondary | ICD-10-CM

## 2022-02-08 NOTE — Patient Instructions (Signed)
Consider warm compresses to left breast as needed until skin pimple clears  Watch for any surrounding redness or swelling  Set up screening mammogram - after pimple clears.

## 2022-02-08 NOTE — Progress Notes (Signed)
   Established Patient Office Visit  Subjective   Patient ID: Kristen Schwartz, female    DOB: June 28, 1971  Age: 51 y.o. MRN: [id number]  Chief Complaint  Patient presents with   Breast Pain    Patient complains of left breast pain, x1 week    HPI   Left breast pain for about 1 week.  She noticed small "pimple "left breast medial aspect of the areola about a week ago.  This is actually less painful today.  No drainage.  No surrounding cellulitis changes.  No fevers or chills.  No history of MRSA.  [name] states she has never had mammogram but is willing to schedule.  No recent breast discharge.  Her mother passed away recently from complications of multiple myeloma at age 78.  Past Medical History:  Diagnosis Date   H/O: pneumonia    Recurrent UTI    Uterine fibroid    Past Surgical History:  Procedure Laterality Date   GALLBLADDER SURGERY     none      reports that she has never smoked. She has never used smokeless tobacco. She reports that she does not drink alcohol and does not use drugs. family history includes Cancer in her mother; Mitral valve prolapse in her mother. Allergies  Allergen Reactions   Codeine Sulfate     REACTION: gi upset    Review of Systems  Constitutional:  Negative for chills, fever and weight loss.     Objective:     BP 110/68 (BP Location: Left Arm, Patient Position: Sitting, Cuff Size: Normal)   Pulse (!) 58   Temp 97.9 F (36.6 C) (Oral)   Ht 5' 5.5" (1.664 m)   Wt 132 lb 12.8 oz (60.2 kg)   SpO2 99%   BMI 21.76 kg/m    Physical Exam Vitals reviewed.  Constitutional:      Appearance: Normal appearance.  Cardiovascular:     Rate and Rhythm: Normal rate and regular rhythm.  Pulmonary:     Comments: Breast exam reveals no nipple inversion or skin dimpling.  Left breast medial aspect areola small approximately 2 mm pustule with no significant surrounding erythema.  No cellulitis changes.  No warmth.  No fluctuance. No palpated left or  right breast masses. Neurological:     Mental Status: She is alert.     No results found for any visits on 02/08/22.    The 10-year ASCVD risk score (Arnett DK, et al., 2019) is: 1%    Assessment & Plan:   Patient has small pimple left breast.  This appears to be resolving.  No surrounding cellulitis changes. -Recommend warm compresses few times daily. -Follow-up immediately for any erythema, swelling, or other concerns.  No indication for any antibiotics at this time.  -We have recommend she schedule initial screening mammogram after skin lesion above resolves  No follow-ups on file.    Carolann Littler, MD

## 2022-05-26 DIAGNOSIS — H40013 Open angle with borderline findings, low risk, bilateral: Secondary | ICD-10-CM | POA: Diagnosis not present

## 2022-07-27 DIAGNOSIS — L814 Other melanin hyperpigmentation: Secondary | ICD-10-CM | POA: Diagnosis not present

## 2022-07-27 DIAGNOSIS — Q825 Congenital non-neoplastic nevus: Secondary | ICD-10-CM | POA: Diagnosis not present

## 2022-07-27 DIAGNOSIS — D2261 Melanocytic nevi of right upper limb, including shoulder: Secondary | ICD-10-CM | POA: Diagnosis not present

## 2022-07-27 DIAGNOSIS — L918 Other hypertrophic disorders of the skin: Secondary | ICD-10-CM | POA: Diagnosis not present

## 2023-07-24 DIAGNOSIS — M25521 Pain in right elbow: Secondary | ICD-10-CM | POA: Diagnosis not present

## 2023-07-24 DIAGNOSIS — S42401A Unspecified fracture of lower end of right humerus, initial encounter for closed fracture: Secondary | ICD-10-CM | POA: Diagnosis not present

## 2023-08-01 ENCOUNTER — Ambulatory Visit
Admission: RE | Admit: 2023-08-01 | Discharge: 2023-08-01 | Disposition: A | Discharge status: Home / Self Care | Payer: BC Managed Care – PPO | Source: Ambulatory Visit | Attending: Family Medicine | Admitting: Family Medicine

## 2023-08-01 ENCOUNTER — Encounter: Payer: Self-pay | Admitting: Family Medicine

## 2023-08-01 ENCOUNTER — Inpatient Hospital Stay
Admission: RE | Admit: 2023-08-01 | Discharge: 2023-08-01 | Disposition: A | Discharge status: Home / Self Care | Payer: Self-pay | Source: Ambulatory Visit | Attending: Family Medicine | Admitting: Family Medicine

## 2023-08-01 ENCOUNTER — Ambulatory Visit (INDEPENDENT_AMBULATORY_CARE_PROVIDER_SITE_OTHER): Payer: BC Managed Care – PPO | Admitting: Family Medicine

## 2023-08-01 VITALS — BP 120/70 | HR 85 | Temp 97.9°F | Ht 66.25 in | Wt 143.2 lb

## 2023-08-01 DIAGNOSIS — S52121A Displaced fracture of head of right radius, initial encounter for closed fracture: Secondary | ICD-10-CM | POA: Insufficient documentation

## 2023-08-01 DIAGNOSIS — S59901A Unspecified injury of right elbow, initial encounter: Secondary | ICD-10-CM

## 2023-08-01 DIAGNOSIS — S52101A Unspecified fracture of upper end of right radius, initial encounter for closed fracture: Secondary | ICD-10-CM | POA: Diagnosis not present

## 2023-08-01 DIAGNOSIS — Z1211 Encounter for screening for malignant neoplasm of colon: Secondary | ICD-10-CM

## 2023-08-01 DIAGNOSIS — M79644 Pain in right finger(s): Secondary | ICD-10-CM | POA: Insufficient documentation

## 2023-08-01 DIAGNOSIS — S52124A Nondisplaced fracture of head of right radius, initial encounter for closed fracture: Secondary | ICD-10-CM | POA: Diagnosis not present

## 2023-08-01 DIAGNOSIS — K5900 Constipation, unspecified: Secondary | ICD-10-CM | POA: Diagnosis not present

## 2023-08-01 NOTE — Progress Notes (Signed)
Ph: [phone] Fax: [phone]   Patient ID: Kristen Schwartz, female    DOB: 1970/09/17, 52 y.o.   MRN: [id number]  This visit was conducted in person.  BP 120/70   Pulse 85   Temp 97.9 F (36.6 C) (Oral)   Ht 5' 6.25" (1.683 m)   Wt 143 lb 4 oz (65 kg)   SpO2 97%   BMI 22.95 kg/m    CC: transfer of care  Subjective:   HPI: Kristen Schwartz is a 52 y.o. female presenting on 08/01/2023 for Transitions Of Care (TOC from Dr Docia Furl. )   Transfer of care from Dr Caryl Never at [place].   Seen at [facility] last week with R elbow sprain after FOOSH fall 07/23/2023. Placed in sling. Xrays didn't show obvious fracture.  She was initially able to fully flex/extend but it has progressively gotten worse.  Treating with ibuprofen initially but not since.  No pain when immobile.  No prior elbow injury.  R handed.   [facility] imaging: EXAM: CR right elbow, 2  View. COMPARISON: None provided.   FINDINGS and IMPRESSION: Limited examination due to limited patient positioning. If there is persistent clinical concern for an occult fracture, recommend follow-up radiographs in 7-10 days for further evaluation. BONES: No acute fracture of the radius. No acute fracture of the ulna. No acute fracture of the distal humerus JOINTS: The joint spaces appear within normal limits. No dislocation SOFT TISSUES: No elbow joint effusion is identified, however, evaluation for effusion is limited due to suboptimal positioning on the lateral view. Electronically signed by: Jaynie Bream, D.O. on 07/24/2023 09:33:39     New constipation over the past 3 months - rabbit pellets.  Has started taking probiotic in the past 2 weeks and miralax in last few days.  She also has been on MVI (one a day + ocuvite) for years as well as quercetin with bromelain. MVIs don't have significant iron.  No cold intolerance, skin or hair changes, fatigue, palpitations.   Over the past 3 months notes increased  popping of joints especially R 3rd PIP joint. Intermittently painful.   On Torpol XL 25mg  1/2 tab every other day for ?ET.  Risk analyst.      Relevant past medical, surgical, family and social history reviewed and updated as indicated. Interim medical history since our last visit reviewed. Allergies and medications reviewed and updated. Outpatient Medications Prior to Visit  Medication Sig Dispense Refill   Multiple Vitamin (MULTIVITAMIN WITH MINERALS) TABS tablet Take 1 tablet by mouth daily.     Multiple Vitamins-Minerals (OCUVITE PO) Take by mouth daily.     Probiotic Product (ALIGN PO) Take by mouth daily.     QUERCETIN PO Take by mouth daily. Plus Bromeain     aspirin EC 325 MG tablet Take 325 mg by mouth every 6 (six) hours as needed for mild pain.     metoprolol succinate (TOPROL-XL) 25 MG 24 hr tablet TAKE 1 TABLET BY MOUTH EVERY DAY (Patient taking differently: Takes 0.5 mg every other day) 90 tablet 1   metoprolol succinate (TOPROL-XL) 25 MG 24 hr tablet Take 0.5 tablets (12.5 mg total) by mouth every other day.     amoxicillin-clavulanate (AUGMENTIN) 875-125 MG tablet Take 1 tablet by mouth 2 (two) times daily. 20 tablet 0   trolamine salicylate (ASPERCREME) 10 % cream Apply 1 application topically as needed for muscle pain.     No facility-administered medications prior to visit.  Per HPI unless specifically indicated in ROS section below Review of Systems  Objective:  BP 120/70   Pulse 85   Temp 97.9 F (36.6 C) (Oral)   Ht 5' 6.25" (1.683 m)   Wt 143 lb 4 oz (65 kg)   SpO2 97%   BMI 22.95 kg/m   Wt Readings from Last 3 Encounters:  08/01/23 143 lb 4 oz (65 kg)  02/08/22 132 lb 12.8 oz (60.2 kg)  04/28/21 125 lb 14.4 oz (57.1 kg)      Physical Exam Vitals and nursing note reviewed.  Constitutional:      Appearance: Normal appearance. She is not ill-appearing.  HENT:     Head: Normocephalic and atraumatic.     Mouth/Throat:     Mouth: Mucous  membranes are moist.     Pharynx: Oropharynx is clear. No oropharyngeal exudate or posterior oropharyngeal erythema.  Eyes:     Extraocular Movements: Extraocular movements intact.     Pupils: Pupils are equal, round, and reactive to light.  Cardiovascular:     Rate and Rhythm: Normal rate and regular rhythm.     Pulses: Normal pulses.     Heart sounds: Normal heart sounds. No murmur heard. Pulmonary:     Effort: Pulmonary effort is normal. No respiratory distress.     Breath sounds: Normal breath sounds. No wheezing, rhonchi or rales.  Musculoskeletal:        General: Tenderness present.     Right lower leg: No edema.     Left lower leg: No edema.     Comments:  L arm WNL R arm in sling/splint - removed  No appreciable point tenderness to palpation  FROM flexion and extension with discomfort at both ends of range  Reproducible pain to forearm with supination/pronation at wrist  Skin:    General: Skin is warm and dry.     Findings: No rash.  Neurological:     Mental Status: She is alert.  Psychiatric:        Mood and Affect: Mood normal.        Behavior: Behavior normal.        Assessment & Plan:   Problem List Items Addressed This Visit     Fracture of radial head, right, closed - Primary    Update xrays today - consistent with R radial head fracture.  Will remove from splint, recommended continue sling use most of the day, but removing once every day to work on ROM in flexion/extension of wrist.  Recommend f/u 1 week with sports medicine to follow fracture      Constipation    Notes recent constipation over the past few months without significant change in diet habits.  Recently started align probiotic without significant benefit, more recently started miralax with benefit.  Ok to continue miralax at this time.  She doesn't have significant iron in MVIs she takes.  Will also refer to GI for colon cancer screening as overdue.       Pain of right middle finger     Suspicious for trigger finger.  No reproducible discomfort today - will monitor, consider sports med eval for steroid injection.       Other Visit Diagnoses     Special screening for malignant neoplasms, colon       Relevant Orders   Ambulatory referral to Gastroenterology   Elbow injury, right, initial encounter       Relevant Orders   DG Elbow Complete Right   DG  Outside Films Extremity (Completed)        No orders of the defined types were placed in this encounter.   Orders Placed This Encounter  Procedures   DG Elbow Complete Right    Standing Status:   Future    Number of Occurrences:   1    Standing Expiration Date:   07/31/2024    Order Specific Question:   Reason for Exam (SYMPTOM  OR DIAGNOSIS REQUIRED)    Answer:   right elbow injiury after fall, has been in sling immobilizer for 8 days    Order Specific Question:   Is patient pregnant?    Answer:   No    Order Specific Question:   Preferred imaging location?    Answer:   [facility]   DG Outside Films Extremity    Right view elbow    Standing Status:   Future    Number of Occurrences:   1    Standing Expiration Date:   07/31/2024    Order Specific Question:   Where was this CD imported?    Answer:   [facility]   Ambulatory referral to Gastroenterology    Referral Priority:   Routine    Referral Type:   Consultation    Referral Reason:   Specialty Services Required    Number of Visits Requested:   1    Patient Instructions  R elbow xray today  Continue current medicines.  Continue Align probiotic. May continue miralax as needed.  Good to see you today.  We will refer you for colonoscopy in [place].   Follow up plan: No follow-ups on file.  Eustaquio Boyden, MD

## 2023-08-01 NOTE — Assessment & Plan Note (Signed)
Notes recent constipation over the past few months without significant change in diet habits.  Recently started align probiotic without significant benefit, more recently started miralax with benefit.  Ok to continue miralax at this time.  She doesn't have significant iron in MVIs she takes.  Will also refer to GI for colon cancer screening as overdue.

## 2023-08-01 NOTE — Assessment & Plan Note (Addendum)
Suspicious for trigger finger.  No reproducible discomfort today - will monitor, consider sports med eval for steroid injection.

## 2023-08-01 NOTE — Patient Instructions (Addendum)
R elbow xray today  Continue current medicines.  Continue Align probiotic. May continue miralax as needed.  Good to see you today.  We will refer you for colonoscopy in [place].

## 2023-08-01 NOTE — Assessment & Plan Note (Signed)
Update xrays today - consistent with R radial head fracture.  Will remove from splint, recommended continue sling use most of the day, but removing once every day to work on ROM in flexion/extension of wrist.  Recommend f/u 1 week with sports medicine to follow fracture

## 2023-08-08 ENCOUNTER — Telehealth: Payer: Self-pay

## 2023-08-08 ENCOUNTER — Encounter: Payer: Self-pay | Admitting: Family Medicine

## 2023-08-08 ENCOUNTER — Ambulatory Visit (INDEPENDENT_AMBULATORY_CARE_PROVIDER_SITE_OTHER): Payer: BC Managed Care – PPO | Admitting: Family Medicine

## 2023-08-08 VITALS — BP 110/70 | HR 67 | Temp 98.2°F | Ht 66.25 in | Wt 144.2 lb

## 2023-08-08 DIAGNOSIS — S52124A Nondisplaced fracture of head of right radius, initial encounter for closed fracture: Secondary | ICD-10-CM

## 2023-08-08 NOTE — Telephone Encounter (Signed)
Elbow disc mailed back to patient.

## 2023-08-08 NOTE — Progress Notes (Unsigned)
Kristen Tant T. Khalil Belote, MD, CAQ Sports Medicine [facility] [address] [place], [address]  Phone: [phone]  FAX: [phone]  Kristen Schwartz - 52 y.o. female  MRN [id number]  Date of Birth: 1971/03/02  Date: 08/08/2023  PCP: Kristen Boyden, MD  Referral: Kristen Boyden, MD  Chief Complaint  Patient presents with   Elbow Injury    Closed nondisplaced fracture of head of right radius   Subjective:   Kristen Schwartz is a 52 y.o. very pleasant female patient with Body mass index is 23.11 kg/m. who presents with the following:  She is a very pleasant patient who is a new patient to me.  She is seen at the request of Dr. Sharen Schwartz for evaluation and management of a minimally displaced radial head fracture that occurred on the below date.  07/25/2023 DOI  While she was walking, she did fall through an electric fence.  She does not recall all of the exact details of the fall and injury.  Her legs were straight and her arm was back behind her and she fell in someway on the arm.  She has been in a sling since that time, and she initially was in a posterior splint prior to seeing my partner Dr. Sharen Schwartz.  He spoke to me at the time of his office visit, and I recommended discontinuing the posterior splint.  Recommended the patient take off her sling and work on flexion extension for the next week until I am able to see her.  07/25/2023 DOI    Review of Systems is noted in the HPI, as appropriate  Objective:   BP 110/70 (BP Location: Left Arm, Patient Position: Sitting, Cuff Size: Normal)   Pulse 67   Temp 98.2 F (36.8 C) (Temporal)   Ht 5' 6.25" (1.683 m)   Wt 144 lb 4 oz (65.4 kg)   SpO2 98%   BMI 23.11 kg/m   GEN: No acute distress; alert,appropriate. PULM: Breathing comfortably in no respiratory distress PSYCH: Normally interactive.   She does have restriction in flexion and extension as well as supination and pronation.   There is some mild swelling, but no bruising.  She does have some pain with palpation at the radial head with rotation.  Laboratory and Imaging Data: DG Elbow Complete Right  Result Date: 08/05/2023 CLINICAL DATA:  Trauma. EXAM: RIGHT ELBOW - COMPLETE 4 VIEW COMPARISON:  07/24/2023. FINDINGS: Anterior and posterior fat pads are noted. There is a minimally displaced intra-articular fracture of the proximal radius. IMPRESSION: Proximal radius fracture. Electronically Signed   By: Layla Maw M.D.   On: 08/05/2023 18:57   DG Outside Films Extremity  Result Date: 08/01/2023 This examination belongs to an outside facility and is stored here for comparison purposes only.  Contact the originating outside institution for any associated report or interpretation.    Assessment and Plan:     ICD-10-CM   1. Closed nondisplaced fracture of head of right radius, initial encounter  S52.124A      Minimally displaced radial head fracture.  She has done well thus far wearing her sling.  I am going to have her wear her sling for an additional 2 weeks, but she is to take her elbow out of the sling at least 3 or 4 times a day and work on flexion, extension, and rotation maneuvers.  I would anticipate that her motion will gradually improve.  Social: Right now her arm is causing her  functional disability and she is minimally able to use it for her activities of daily living as well as any kind of activity.  Disposition: Return in about 3 weeks (around 08/29/2023).  Dragon [employer] One speech-to-text software was used for transcription in this dictation.  Possible transcriptional errors can occur using Animal nutritionist.   Signed,  Kristen Galea. Detrice Cales, MD   Outpatient Encounter Medications as of 08/08/2023  Medication Sig   metoprolol succinate (TOPROL-XL) 25 MG 24 hr tablet Take 0.5 tablets (12.5 mg total) by mouth every other day.   Multiple Vitamin (MULTIVITAMIN WITH MINERALS) TABS tablet Take 1  tablet by mouth daily.   Multiple Vitamins-Minerals (OCUVITE PO) Take by mouth daily.   Probiotic Product (ALIGN PO) Take by mouth daily.   QUERCETIN PO Take by mouth daily. Plus Bromeain   No facility-administered encounter medications on file as of 08/08/2023.

## 2023-08-08 NOTE — Patient Instructions (Addendum)
Wear your sling for about the next 12 days and then ok to stop it.  Work on your motion 4 times a day   Voltaren 1% gel, over the counter You can apply up to 4 times a day  This can be applied to any joint: knee, wrist, fingers, elbows, shoulders, feet and ankles. Can apply to any tendon: tennis elbow, achilles, tendon, rotator cuff or any other tendon.  Minimal is absorbed in the bloodstream: ok with oral anti-inflammatory or a blood thinner.  Cost is about 9 dollars

## 2023-08-13 DIAGNOSIS — M25522 Pain in left elbow: Secondary | ICD-10-CM | POA: Diagnosis not present

## 2023-09-02 ENCOUNTER — Encounter: Payer: Self-pay | Admitting: Family Medicine

## 2023-09-02 ENCOUNTER — Ambulatory Visit: Payer: BC Managed Care – PPO | Admitting: Family Medicine

## 2023-09-02 VITALS — BP 120/72 | HR 72 | Temp 97.6°F | Ht 66.25 in | Wt 145.4 lb

## 2023-09-02 NOTE — Patient Instructions (Addendum)
No charge visit.  Return Monday for follow up with Dr Patsy Lager.  You may call  GI to schedule an appointment at [phone].

## 2023-09-02 NOTE — Progress Notes (Addendum)
  Ph: [phone] Fax: [phone]   Patient ID: Kristen Schwartz, female    DOB: 24-Feb-1971, 52 y.o.   MRN: [id number]  This visit was conducted in person.  BP 120/72   Pulse 72   Temp 97.6 F (36.4 C) (Oral)   Ht 5' 6.25" (1.683 m)   Wt 145 lb 6 oz (65.9 kg)   SpO2 98%   BMI 23.29 kg/m    CC: f/u fracture Subjective:   HPI: Kristen Schwartz is a 52 y.o. female presenting on 09/02/2023 for Medical Management of Chronic Issues (Here for 3 wk R arm fx. Wants to discuss possible PT. Per pt, f/u was supposed to be with Dr Patsy Lager. )   Recently diagnosed closed nondisplaced fracture of R radial head after [facility] 07/25/2023.  Saw Dr Patsy Lager 08/08/2023 with plan return in 3 months - this was supposed to be sports med f/u with Dr Patsy Lager.  She notes arm is better but still cannot fully extend arm   She will return Monday for recheck with sports medicine.       Relevant past medical, surgical, family and social history reviewed and updated as indicated. Interim medical history since our last visit reviewed. Allergies and medications reviewed and updated. Outpatient Medications Prior to Visit  Medication Sig Dispense Refill   metoprolol succinate (TOPROL-XL) 25 MG 24 hr tablet Take 0.5 tablets (12.5 mg total) by mouth every other day.     Multiple Vitamin (MULTIVITAMIN WITH MINERALS) TABS tablet Take 1 tablet by mouth daily.     Multiple Vitamins-Minerals (OCUVITE PO) Take by mouth daily.     Probiotic Product (ALIGN PO) Take by mouth daily.     QUERCETIN PO Take by mouth daily. Plus Bromeain     No facility-administered medications prior to visit.     Per HPI unless specifically indicated in ROS section below Review of Systems  Objective:  BP 120/72   Pulse 72   Temp 97.6 F (36.4 C) (Oral)   Ht 5' 6.25" (1.683 m)   Wt 145 lb 6 oz (65.9 kg)   SpO2 98%   BMI 23.29 kg/m   Wt Readings from Last 3 Encounters:  09/02/23 145 lb 6 oz (65.9 kg)  08/08/23 144 lb 4 oz (65.4 kg)   08/01/23 143 lb 4 oz (65 kg)      Physical Exam     Assessment & Plan:   Problem List Items Addressed This Visit     Fracture of radial head, right, closed - Primary   Erroneously scheduled with me-  will return for f/u with Dr Patsy Lager. No charge visit today         No orders of the defined types were placed in this encounter.   No orders of the defined types were placed in this encounter.   Patient Instructions  No charge visit.  Return Monday for follow up with Dr Patsy Lager.  You may call [facility] to schedule an appointment at [phone].   Follow up plan: Return if symptoms worsen or fail to improve.  Eustaquio Boyden, MD

## 2023-09-02 NOTE — Assessment & Plan Note (Signed)
Erroneously scheduled with me-  will return for f/u with Dr Patsy Lager. No charge visit today

## 2023-09-07 NOTE — Progress Notes (Signed)
 Owen Pratte T. Clova Morlock, MD, CAQ Sports Medicine [facility] at [facility] [address] [place], [address]  Phone: [phone]  FAX: [phone]  Kristen Schwartz - 53 y.o. female  MRN [id number]  Date of Birth: 11/13/1970  Date: 09/08/2023  PCP: Rilla Baller, MD  Referral: Rilla Baller, MD  Chief Complaint  Patient presents with   Radial Heal Fracture    Right-follow up   Subjective:   Kristen Schwartz is a 53 y.o. very pleasant female patient with Body mass index is 23.27 kg/m. who presents with the following:  The patient presents for follow-up right-sided radial head fracture.  She actually inadvertently was given an appointment with her primary care doctor instead of me.  She saw Dr. Rilla on September 02, 2023.  She is here to follow-up with me today regarding fracture follow-up.  She has done a great job working on her motion including extension, flexion as well as pronation supination.  She does have some pain with terminal motion, particular with extension, however the motion is good at this point.  She does feel as if she has some weakness about the elbow, as well.  She actually fell and struck her left elbow a few weeks ago, as well.  There was no fracture.  She is having some pain in and about the ulnar groove on the left.  Rotation and strength are normal.  Review of Systems is noted in the HPI, as appropriate  Objective:   BP 100/70 (BP Location: Left Arm, Patient Position: Sitting, Cuff Size: Normal)   Pulse 95   Temp 97.8 F (36.6 C) (Temporal)   Ht 5' 6.25 (1.683 m)   Wt 145 lb 4 oz (65.9 kg)   SpO2 98%   BMI 23.27 kg/m   GEN: No acute distress; alert,appropriate. PULM: Breathing comfortably in no respiratory distress PSYCH: Normally interactive.   The right elbow lacks approximately 6 to 7 degrees of extension.  Flexion is full.  Rotational movements is full.  Nontender at the radial head.  Nontender at the  epicondyles.  Strength is 5/5.  She does have a modest restriction of grip range of motion with some minor active loss of motion at the third digit, however full passive motion.  There is no swelling in the right upper extremity.  Left elbow has full extension, flexion, rotational maneuvers.  No tenderness at the radial head, olecranon, medial or lateral epicondyles.  No tenderness at the tendinous insertions of the elbow.  She does have some modest tenderness at the ulnar groove.  Laboratory and Imaging Data:  Assessment and Plan:     ICD-10-CM   1. Closed displaced fracture of head of right radius with routine healing, subsequent encounter  S52.121D Ambulatory referral to Physical Therapy    2. Bilateral elbow joint pain  M25.521 Ambulatory referral to Physical Therapy   M25.522      Clinically healed nondisplaced fracture of the radial head.  She has done quite well.  Her motion looks very good for this point in her recovery.  She has done a great job on working on her motion on her own.  I am going to have her continue to work on some motion and do some light strengthening, and I gave her home rehab program to work on daily.  Will also send her to formal physical therapy to help ensure that she does have full return of motion in all directions on the right elbow.  Left elbow pain after acute trauma.  At this point she has some pain only in the ulnar groove.  Anticipate this will improve with additional time, however she can also work on some rehab and strengthening of the left elbow as well as well as discussed this with physical therapy.  Orders placed today for conditions managed today: Orders Placed This Encounter  Procedures   Ambulatory referral to Physical Therapy    Disposition: No follow-ups on file.  Dragon [employer] One speech-to-text software was used for transcription in this dictation.  Possible transcriptional errors can occur using Animal nutritionist.    Signed,  Jacques DASEN. Mairen Wallenstein, MD   Outpatient Encounter Medications as of 09/08/2023  Medication Sig   metoprolol  succinate (TOPROL -XL) 25 MG 24 hr tablet Take 0.5 tablets (12.5 mg total) by mouth every other day.   Multiple Vitamin (MULTIVITAMIN WITH MINERALS) TABS tablet Take 1 tablet by mouth daily.   Multiple Vitamins-Minerals (OCUVITE PO) Take by mouth daily.   Probiotic Product (ALIGN PO) Take by mouth daily.   QUERCETIN PO Take by mouth daily. Plus Bromeain   No facility-administered encounter medications on file as of 09/08/2023.

## 2023-09-08 ENCOUNTER — Encounter: Payer: Self-pay | Admitting: Family Medicine

## 2023-09-08 ENCOUNTER — Ambulatory Visit (INDEPENDENT_AMBULATORY_CARE_PROVIDER_SITE_OTHER): Payer: BC Managed Care – PPO | Admitting: Family Medicine

## 2023-09-08 VITALS — BP 100/70 | HR 95 | Temp 97.8°F | Ht 66.25 in | Wt 145.2 lb

## 2023-09-08 DIAGNOSIS — M25522 Pain in left elbow: Secondary | ICD-10-CM

## 2023-09-08 DIAGNOSIS — S52121D Displaced fracture of head of right radius, subsequent encounter for closed fracture with routine healing: Secondary | ICD-10-CM

## 2023-09-08 DIAGNOSIS — M25521 Pain in right elbow: Secondary | ICD-10-CM

## 2023-10-05 ENCOUNTER — Encounter: Payer: Self-pay | Admitting: Gastroenterology

## 2023-10-17 ENCOUNTER — Ambulatory Visit: Payer: Self-pay | Admitting: Family Medicine

## 2023-10-17 NOTE — Telephone Encounter (Signed)
  Chief Complaint: Right ankle injury Symptoms: Swelling Frequency: Intermittent (with weight) Disposition: [] ED /[] Urgent Care (no appt availability in office) / [x] Appointment(In office/virtual)/ []  [facility]/ [] Home Care/ [] Refused Recommended Disposition /[] [facility]/ []  Follow-up with PCP Additional Notes: Pt fell yesterday around lunch time and injured her right ankle. Pt reports pain when she puts weight on it and is unable to put weight on it. Pt has a compression sock on it currently. Pt is putting ice on it. Pt scheduled for appt tomorrow with her PCP. This RN educated pt on home care, new-worsening symptoms, when to call back/seek emergent care. Pt verbalized understanding and agrees to plan.     Copied from CRM [id number]. Topic: Clinical - Red Word Triage >> Oct 17, 2023  9:54 AM Juvenal Opoka wrote: Red Word that prompted transfer to Nurse Triage: Patient called in regarding scheduling a possible xray, patient fell down stairs yesterday and twisted ankle, believe it is sprained and can not put pressure on it Reason for Disposition  [1] SEVERE pain AND [2] not improved 2 hours after pain medicine/ice packs  Answer Assessment - Initial Assessment Questions 1. MECHANISM: "How did the injury happen?" (e.g., twisting injury, direct blow)      Fall  2. ONSET: "When did the injury happen?" (Minutes or hours ago)      Yesterday 3. LOCATION: "Where is the injury located?"      Right ankle 4. APPEARANCE of INJURY: "What does the injury look like?"      Swelling 5. WEIGHT-BEARING: "Can you put weight on that foot?" "Can you walk (four steps or more)?"       No 7. PAIN: "Is there pain?" If Yes, ask: "How bad is the pain?"    (e.g., Scale 1-10; or mild, moderate, severe)   - NONE (0): no pain.   - MILD (1-3): doesn't interfere with normal activities.    - MODERATE (4-7): interferes with normal activities (e.g., work or school) or awakens from sleep, limping.    -  SEVERE (8-10): excruciating pain, unable to do any normal activities, unable to walk.      Only when put weight on it  Protocols used: Ankle and Foot Injury-A-AH

## 2023-10-18 ENCOUNTER — Ambulatory Visit (INDEPENDENT_AMBULATORY_CARE_PROVIDER_SITE_OTHER)
Admission: RE | Admit: 2023-10-18 | Discharge: 2023-10-18 | Disposition: A | Discharge status: Home / Self Care | Payer: BC Managed Care – PPO | Source: Ambulatory Visit | Attending: Internal Medicine | Admitting: Internal Medicine

## 2023-10-18 ENCOUNTER — Ambulatory Visit (INDEPENDENT_AMBULATORY_CARE_PROVIDER_SITE_OTHER): Payer: BC Managed Care – PPO | Admitting: Internal Medicine

## 2023-10-18 ENCOUNTER — Encounter: Payer: Self-pay | Admitting: Internal Medicine

## 2023-10-18 ENCOUNTER — Ambulatory Visit: Payer: BC Managed Care – PPO | Admitting: Family Medicine

## 2023-10-18 VITALS — BP 110/62 | HR 103 | Temp 98.7°F | Ht 66.25 in | Wt 147.0 lb

## 2023-10-18 DIAGNOSIS — M25571 Pain in right ankle and joints of right foot: Secondary | ICD-10-CM | POA: Diagnosis not present

## 2023-10-18 NOTE — Progress Notes (Signed)
   Subjective:    Patient ID: Kristen Schwartz, female    DOB: 06/03/1971, 53 y.o.   MRN: [id number]  HPI Here due to right ankle pain after a fall  Missed step ---twisted ankle--outside house--- 2 days ago Micah Flesher face first --but able to catch herself  Pain right away Able to get up on her own--but couldn't bear weight from the beginning Borrowed crutches--and used daughter's boot  Did ice it and elevation Took ibuprofen 200mg  at first--none since  Current Outpatient Medications on File Prior to Visit  Medication Sig Dispense Refill   metoprolol succinate (TOPROL-XL) 25 MG 24 hr tablet Take 0.5 tablets (12.5 mg total) by mouth every other day.     Multiple Vitamin (MULTIVITAMIN WITH MINERALS) TABS tablet Take 1 tablet by mouth daily.     Multiple Vitamins-Minerals (OCUVITE PO) Take by mouth daily.     Probiotic Product (ALIGN PO) Take by mouth daily.     QUERCETIN PO Take by mouth daily. Plus Bromeain     No current facility-administered medications on file prior to visit.    Allergies  Allergen Reactions   Codeine Sulfate     REACTION: gi upset    Past Medical History:  Diagnosis Date   H/O: pneumonia    Recurrent UTI    Uterine fibroid     Past Surgical History:  Procedure Laterality Date   GALLBLADDER SURGERY     none      Family History  Problem Relation Age of Onset   Cancer Mother        Multiple myeloma   Mitral valve prolapse Mother     Social History   Socioeconomic History   Marital status: Married    Spouse name: Not on file   Number of children: 0   Years of education: Not on file   Highest education level: Not on file  Occupational History   Not on file  Tobacco Use   Smoking status: Never   Smokeless tobacco: Never  Vaping Use   Vaping status: Never Used  Substance and Sexual Activity   Alcohol use: No   Drug use: Never   Sexual activity: Not Currently  Other Topics Concern   Not on file  Social History Narrative   Lives with husband  and daughter    Occ: Gaffer    Social Drivers of Health   Financial Resource Strain: Not on file  Food Insecurity: Not on file  Transportation Needs: Not on file  Physical Activity: Not on file  Stress: Not on file  Social Connections: Not on file  Intimate Partner Violence: Not on file   Review of Systems     Objective:   Physical Exam Constitutional:      Appearance: Normal appearance.  Musculoskeletal:     Comments: Right ankle---swelling along lateral malleolus down to foot. Sig ecchymoses as well Tenderness directly over the lateral malleolus  Neurological:     Mental Status: She is alert.            Assessment & Plan:

## 2023-10-18 NOTE — Assessment & Plan Note (Addendum)
Location is typical for a bad sprain--but could have avulsion as well Will check x-ray--looks negative but still awaiting overread after >30 minutes  Will send her out and call report Avoid weight bearing till pain is better Use compression wrap or air cast for now Would send to ortho if any fracture Ice/elevation/ibuprofen prn

## 2023-10-19 DIAGNOSIS — S93401A Sprain of unspecified ligament of right ankle, initial encounter: Secondary | ICD-10-CM | POA: Diagnosis not present

## 2023-10-28 ENCOUNTER — Ambulatory Visit (AMBULATORY_SURGERY_CENTER): Payer: BC Managed Care – PPO | Admitting: *Deleted

## 2023-10-28 VITALS — Ht 66.0 in | Wt 143.0 lb

## 2023-10-28 DIAGNOSIS — Z1211 Encounter for screening for malignant neoplasm of colon: Secondary | ICD-10-CM

## 2023-10-28 DIAGNOSIS — Z800 Family history of malignant neoplasm of digestive organs: Secondary | ICD-10-CM

## 2023-10-28 MED ORDER — SUFLAVE 178.7 G PO SOLR: 1.0000 | Freq: Once | ORAL | 0 refills | Status: AC

## 2023-10-28 NOTE — Progress Notes (Signed)
 Pt's name and DOB verified at the beginning of the pre-visit wit 2 identifiers  Pt denies any difficulty with ambulating,sitting, laying down or rolling side to side  Pt has no issues with ambulation   Pt has no issues moving head neck or swallowing  No egg or soy allergy known to patient   No issues known to pt with past sedation with any surgeries or procedures  Pt denies having issues being intubated   No FH of Malignant Hyperthermia  Pt is not on diet pills or shots  Pt is not on home 02   Pt is not on blood thinners   Pt has frequent issues with constipation RN instructed pt to use Miralax per bottles instructions a week before prep days. Pt states she takes Miralax since this started  Pt is not on dialysis  Pt denise any abnormal heart rhythms   Pt denies any upcoming cardiac testing  Patient's chart reviewed by Cathlyn Parsons CNRA prior to pre-visit and patient appropriate for the [facility].  Pre-visit completed and red dot placed by patient's name on their procedure day (on provider's schedule).    Visit by phone  Pt states weight is 143 lb  IInstructions reviewed. Pt given [facility], [facility] main # and MD on call # prior to instructions.  Pt states understanding of instructions. Instructed to review again prior to procedure. Pt states they will.   Informed pt that they will receive a text or  call from [facility] regarding there prep med.

## 2023-11-09 ENCOUNTER — Other Ambulatory Visit: Payer: Self-pay | Admitting: Family Medicine

## 2023-11-09 DIAGNOSIS — Z1231 Encounter for screening mammogram for malignant neoplasm of breast: Secondary | ICD-10-CM

## 2023-11-17 ENCOUNTER — Encounter: Payer: Self-pay | Admitting: Gastroenterology

## 2023-11-25 ENCOUNTER — Encounter: Payer: Self-pay | Admitting: Gastroenterology

## 2023-11-25 ENCOUNTER — Ambulatory Visit: Payer: BC Managed Care – PPO | Admitting: Gastroenterology

## 2023-11-25 VITALS — BP 100/64 | HR 72 | Temp 98.1°F | Resp 12 | Ht 66.0 in | Wt 143.0 lb

## 2023-11-25 DIAGNOSIS — Z1211 Encounter for screening for malignant neoplasm of colon: Secondary | ICD-10-CM | POA: Diagnosis not present

## 2023-11-25 DIAGNOSIS — K5730 Diverticulosis of large intestine without perforation or abscess without bleeding: Secondary | ICD-10-CM | POA: Diagnosis not present

## 2023-11-25 DIAGNOSIS — D124 Benign neoplasm of descending colon: Secondary | ICD-10-CM

## 2023-11-25 MED ORDER — SODIUM CHLORIDE 0.9 % IV SOLN: 500.0000 mL | Freq: Once | INTRAVENOUS | Status: DC

## 2023-11-25 NOTE — Progress Notes (Signed)
 Called to room to assist during endoscopic procedure.  Patient ID and intended procedure confirmed with present staff. Received instructions for my participation in the procedure from the performing physician.

## 2023-11-25 NOTE — Progress Notes (Signed)
 Sedate, gd SR, tolerated procedure well, VSS, report to RN

## 2023-11-25 NOTE — Patient Instructions (Signed)
-  Handout on diverticulosis provided -await pathology results -repeat colonoscopy for surveillance in 10 years recommended. Date to be determined when pathology result become available   -Continue present medications    YOU HAD AN ENDOSCOPIC PROCEDURE TODAY AT THE [facility]:   Refer to the procedure report that was given to you for any specific questions about what was found during the examination.  If the procedure report does not answer your questions, please call your gastroenterologist to clarify.  If you requested that your care partner not be given the details of your procedure findings, then the procedure report has been included in a sealed envelope for you to review at your convenience later.  YOU SHOULD EXPECT: Some feelings of bloating in the abdomen. Passage of more gas than usual.  Walking can help get rid of the air that was put into your GI tract during the procedure and reduce the bloating. If you had a lower endoscopy (such as a colonoscopy or flexible sigmoidoscopy) you may notice spotting of blood in your stool or on the toilet paper. If you underwent a bowel prep for your procedure, you may not have a normal bowel movement for a few days.  Please Note:  You might notice some irritation and congestion in your nose or some drainage.  This is from the oxygen used during your procedure.  There is no need for concern and it should clear up in a day or so.  SYMPTOMS TO REPORT IMMEDIATELY:  Following lower endoscopy (colonoscopy or flexible sigmoidoscopy):  Excessive amounts of blood in the stool  Significant tenderness or worsening of abdominal pains  Swelling of the abdomen that is new, acute  Fever of 100F or higher  For urgent or emergent issues, a gastroenterologist can be reached at any hour by calling [phone]. Do not use MyChart messaging for urgent concerns.    DIET:  We do recommend a small meal at first, but then you may proceed to your regular  diet.  Drink plenty of fluids but you should avoid alcoholic beverages for 24 hours.  ACTIVITY:  You should plan to take it easy for the rest of today and you should NOT DRIVE or use heavy machinery until tomorrow (because of the sedation medicines used during the test).    FOLLOW UP: Our staff will call the number listed on your records the next business day following your procedure.  We will call around 7:15- 8:00 am to check on you and address any questions or concerns that you may have regarding the information given to you following your procedure. If we do not reach you, we will leave a message.     If any biopsies were taken you will be contacted by phone or by letter within the next 1-3 weeks.  Please call us at [phone] if you have not heard about the biopsies in 3 weeks.    SIGNATURES/CONFIDENTIALITY: You and/or your care partner have signed paperwork which will be entered into your electronic medical record.  These signatures attest to the fact that that the information above on your After Visit Summary has been reviewed and is understood.  Full responsibility of the confidentiality of this discharge information lies with you and/or your care-partner.

## 2023-11-25 NOTE — Op Note (Signed)
 [facility] Patient Name: Kristen Schwartz Procedure Date: 11/25/2023 10:42 AM MRN: [id number] Endoscopist: Lorin Picket E. Tomasa Rand , MD, [id number] Age: 53 Referring MD:  Date of Birth: 1970-10-04 Gender: Female Account #: [id number] Procedure:                Colonoscopy Indications:              Screening for colorectal malignant neoplasm, This                            is the patient's first colonoscopy Medicines:                Monitored Anesthesia Care Procedure:                Pre-Anesthesia Assessment:                           - Prior to the procedure, a History and Physical                            was performed, and patient medications and                            allergies were reviewed. The patient's tolerance of                            previous anesthesia was also reviewed. The risks                            and benefits of the procedure and the sedation                            options and risks were discussed with the patient.                            All questions were answered, and informed consent                            was obtained. Prior Anticoagulants: The patient has                            taken no anticoagulant or antiplatelet agents. ASA                            Grade Assessment: II - A patient with mild systemic                            disease. After reviewing the risks and benefits,                            the patient was deemed in satisfactory condition to                            undergo the procedure.  After obtaining informed consent, the colonoscope                            was passed under direct vision. Throughout the                            procedure, the patient's blood pressure, pulse, and                            oxygen saturations were monitored continuously. The                            CF [id number] #[id number] was introduced through the anus                            and advanced to  the the cecum, identified by                            appendiceal orifice and ileocecal valve. The                            colonoscopy was performed without difficulty. The                            patient tolerated the procedure well. The quality                            of the bowel preparation was good. The ileocecal                            valve, appendiceal orifice, and rectum were                            photographed. The bowel preparation used was                            SUFLAVE via split dose instruction. Scope In: 10:49:30 AM Scope Out: 11:09:40 AM Scope Withdrawal Time: 0 hours 14 minutes 54 seconds  Total Procedure Duration: 0 hours 20 minutes 10 seconds  Findings:                 The perianal and digital rectal examinations were                            normal. Pertinent negatives include normal                            sphincter tone and no palpable rectal lesions.                           A patchy area of mildly inflamed mucosa was found                            in the descending colon. Biopsies were taken with a  cold forceps for histology. Estimated blood loss                            was minimal.                           Multiple medium-mouthed and small-mouthed                            diverticula were found in the sigmoid colon,                            descending colon and ascending colon. There was                            narrowing of the colon in association with the                            diverticular opening.                           The exam was otherwise normal throughout the                            examined colon.                           The retroflexed view of the distal rectum and anal                            verge was normal and showed no anal or rectal                            abnormalities. Complications:            No immediate complications. Estimated Blood Loss:     Estimated  blood loss was minimal. Impression:               - Focal inflamed mucosa in the descending colon.                            Biopsied. This may be bowel prep related.                           - Moderate diverticulosis in the sigmoid colon, in                            the descending colon and in the ascending colon.                            There was narrowing of the colon in association                            with the diverticular opening.                           -  The distal rectum and anal verge are normal on                            retroflexion view. Recommendation:           - Patient has a contact number available for                            emergencies. The signs and symptoms of potential                            delayed complications were discussed with the                            patient. Return to normal activities tomorrow.                            Written discharge instructions were provided to the                            patient.                           - Resume previous diet.                           - Continue present medications.                           - Await pathology results.                           - Repeat colonoscopy in 10 years for screening                            purposes. Despina Boan E. Tomasa Rand, MD 11/25/2023 11:14:52 AM This report has been signed electronically.

## 2023-11-25 NOTE — Progress Notes (Signed)
 Pt's states no medical or surgical changes since previsit or office visit.

## 2023-11-25 NOTE — Progress Notes (Signed)
 [facility] History and Physical   Primary Care Physician:  Eustaquio Boyden, MD   Reason for Procedure:   Colon cancer screening  Plan:    Screening colonoscopy     HPI: Kristen Schwartz is a 53 y.o. female undergoing initial average risk screening colonoscopy.  She has no family history of colon cancer (other than paternal aunt) and no chronic GI symptoms.    Past Medical History:  Diagnosis Date   GERD (gastroesophageal reflux disease)    H/O: pneumonia    Migraines    Recurrent UTI    Uterine fibroid     Past Surgical History:  Procedure Laterality Date   GALLBLADDER SURGERY     none     UPPER GASTROINTESTINAL ENDOSCOPY     WISDOM TOOTH EXTRACTION      Prior to Admission medications   Medication Sig Start Date End Date Taking? Authorizing Provider  metoprolol succinate (TOPROL-XL) 25 MG 24 hr tablet Take 0.5 tablets (12.5 mg total) by mouth every other day. 08/01/23  Yes Eustaquio Boyden, MD  Multiple Vitamin (MULTIVITAMIN WITH MINERALS) TABS tablet Take 1 tablet by mouth daily.   Yes [provider]  Multiple Vitamins-Minerals (OCUVITE PO) Take by mouth daily.   Yes [provider]  polyethylene glycol powder (GLYCOLAX/MIRALAX) 17 GM/SCOOP powder Take 1 Container by mouth once.    [provider]  Probiotic Product (ALIGN PO) Take by mouth daily.    [provider]  QUERCETIN PO Take by mouth daily. Plus Bromeain    [provider]    Current Outpatient Medications  Medication Sig Dispense Refill   metoprolol succinate (TOPROL-XL) 25 MG 24 hr tablet Take 0.5 tablets (12.5 mg total) by mouth every other day.     Multiple Vitamin (MULTIVITAMIN WITH MINERALS) TABS tablet Take 1 tablet by mouth daily.     Multiple Vitamins-Minerals (OCUVITE PO) Take by mouth daily.     polyethylene glycol powder (GLYCOLAX/MIRALAX) 17 GM/SCOOP powder Take 1 Container by mouth once.     Probiotic Product (ALIGN PO) Take by mouth  daily.     QUERCETIN PO Take by mouth daily. Plus Bromeain     Current Facility-Administered Medications  Medication Dose Route Frequency Provider Last Rate Last Admin   0.9 %  sodium chloride infusion  500 mL Intravenous Once Jenel Lucks, MD        Allergies as of 11/25/2023 - Review Complete 11/25/2023  Allergen Reaction Noted   Codeine sulfate Nausea And Vomiting 09/17/2010    Family History  Problem Relation Age of Onset   Cancer Mother        Multiple myeloma   Mitral valve prolapse Mother    Colon polyps Father    Colon cancer Paternal Aunt    Esophageal cancer Neg Hx    Rectal cancer Neg Hx    Stomach cancer Neg Hx     Social History   Socioeconomic History   Marital status: Married    Spouse name: Not on file   Number of children: 0   Years of education: Not on file   Highest education level: Not on file  Occupational History   Not on file  Tobacco Use   Smoking status: Never   Smokeless tobacco: Never  Vaping Use   Vaping status: Never Used  Substance and Sexual Activity   Alcohol use: No   Drug use: Never   Sexual activity: Not Currently    Birth control/protection: Post-menopausal  Other Topics  Concern   Not on file  Social History Narrative   Lives with husband and daughter    Occ: Gaffer    Social Drivers of Health   Financial Resource Strain: Not on file  Food Insecurity: Not on file  Transportation Needs: Not on file  Physical Activity: Not on file  Stress: Not on file  Social Connections: Not on file  Intimate Partner Violence: Not on file    Review of Systems:  All other review of systems negative except as mentioned in the HPI.  Physical Exam: Vital signs BP 118/74   Pulse 90   Temp 98.1 F (36.7 C) (Temporal)   Ht 5\' 6"  (1.676 m)   Wt 143 lb (64.9 kg)   SpO2 99%   BMI 23.08 kg/m   General:   Alert,  Well-developed, well-nourished, pleasant and cooperative in NAD Airway:  Mallampati 1 Lungs:  Clear  throughout to auscultation.   Heart:  Regular rate and rhythm; no murmurs, clicks, rubs,  or gallops. Abdomen:  Soft, nontender and nondistended. Normal bowel sounds.   Neuro/Psych:  Normal mood and affect. A and O x 3   Kristen Schwartz E. Tomasa Rand, MD [facility]

## 2023-11-28 ENCOUNTER — Telehealth: Payer: Self-pay

## 2023-11-28 NOTE — Telephone Encounter (Signed)
 Post procedure follow up call, no answer

## 2023-11-29 LAB — SURGICAL PATHOLOGY

## 2023-12-07 ENCOUNTER — Encounter: Payer: Self-pay | Admitting: Gastroenterology

## 2023-12-07 NOTE — Progress Notes (Signed)
 Kristen Schwartz, The biopsies taken of the abnormal appearing mucosa surprisingly showed evidence of a tubular adenoma which is a common type of precancerous polyp in the colon (1 fragment).  The other fragments taken were benign, with no inflammatory or precancerous changes. These biopsy results were quite surprising, as the mucosal changes did not appear to be consistent at all with a tubular adenoma.  The mucosal changes did not look like a distinct polyp. For this reason, I think it is important that we take another look at your colon in the near future, and potentially resect the abnormal appearing mucosa (even though only 1 biopsy fragment showed adenoma change).  I recommend that we repeat a colonoscopy in 6 months, paying close attention to the area open your colon where the abnormal mucosa was (descending colon).  Please contact our office if you have questions/concerns about these findings and recommendations.

## 2023-12-12 ENCOUNTER — Ambulatory Visit
Admission: RE | Admit: 2023-12-12 | Discharge: 2023-12-12 | Disposition: A | Discharge status: Home / Self Care | Source: Ambulatory Visit | Attending: Family Medicine | Admitting: Family Medicine

## 2023-12-12 DIAGNOSIS — Z1231 Encounter for screening mammogram for malignant neoplasm of breast: Secondary | ICD-10-CM

## 2023-12-15 NOTE — Telephone Encounter (Signed)
 Inbound call from patient requesting to speak with nurse further about recent colonoscopy results. Please advise, thank you.

## 2023-12-15 NOTE — Telephone Encounter (Signed)
 Spoke with pt, resuilts reviewed. Pt knows she will be contacted closer to the mark to scheduled repeat colon.

## 2024-03-17 DIAGNOSIS — J029 Acute pharyngitis, unspecified: Secondary | ICD-10-CM | POA: Diagnosis not present

## 2024-04-20 ENCOUNTER — Encounter: Payer: Self-pay | Admitting: Gastroenterology

## 2024-05-21 ENCOUNTER — Emergency Department (HOSPITAL_BASED_OUTPATIENT_CLINIC_OR_DEPARTMENT_OTHER)
Admission: EM | Admit: 2024-05-21 | Discharge: 2024-05-21 | Disposition: A | Discharge status: Home / Self Care | Source: Ambulatory Visit | Attending: Emergency Medicine | Admitting: Emergency Medicine

## 2024-05-21 ENCOUNTER — Emergency Department (HOSPITAL_BASED_OUTPATIENT_CLINIC_OR_DEPARTMENT_OTHER)

## 2024-05-21 ENCOUNTER — Other Ambulatory Visit: Payer: Self-pay

## 2024-05-21 ENCOUNTER — Ambulatory Visit: Payer: Self-pay

## 2024-05-21 ENCOUNTER — Encounter (HOSPITAL_BASED_OUTPATIENT_CLINIC_OR_DEPARTMENT_OTHER): Payer: Self-pay | Admitting: Emergency Medicine

## 2024-05-21 DIAGNOSIS — R002 Palpitations: Secondary | ICD-10-CM | POA: Insufficient documentation

## 2024-05-21 LAB — CBC WITH DIFFERENTIAL/PLATELET
Abs Immature Granulocytes: 0.02 K/uL (ref 0.00–0.07)
Basophils Absolute: 0.1 K/uL (ref 0.0–0.1)
Basophils Relative: 1 %
Eosinophils Absolute: 0.3 K/uL (ref 0.0–0.5)
Eosinophils Relative: 4 %
HCT: 38.5 % (ref 36.0–46.0)
Hemoglobin: 13 g/dL (ref 12.0–15.0)
Immature Granulocytes: 0 %
Lymphocytes Relative: 29 %
Lymphs Abs: 2.1 K/uL (ref 0.7–4.0)
MCH: 31.8 pg (ref 26.0–34.0)
MCHC: 33.8 g/dL (ref 30.0–36.0)
MCV: 94.1 fL (ref 80.0–100.0)
Monocytes Absolute: 0.5 K/uL (ref 0.1–1.0)
Monocytes Relative: 7 %
Neutro Abs: 4.1 K/uL (ref 1.7–7.7)
Neutrophils Relative %: 59 %
Platelets: 296 K/uL (ref 150–400)
RBC: 4.09 MIL/uL (ref 3.87–5.11)
RDW: 12.8 % (ref 11.5–15.5)
WBC: 7 K/uL (ref 4.0–10.5)
nRBC: 0 % (ref 0.0–0.2)

## 2024-05-21 LAB — BASIC METABOLIC PANEL WITH GFR
Anion gap: 11 (ref 5–15)
BUN: 13 mg/dL (ref 6–20)
CO2: 27 mmol/L (ref 22–32)
Calcium: 9.6 mg/dL (ref 8.9–10.3)
Chloride: 104 mmol/L (ref 98–111)
Creatinine, Ser: 0.93 mg/dL (ref 0.44–1.00)
GFR, Estimated: >60 mL/min (ref >60)
Glucose, Bld: 103 mg/dL — ABNORMAL HIGH (ref 70–99)
Potassium: 4.2 mmol/L (ref 3.5–5.1)
Sodium: 142 mmol/L (ref 135–145)

## 2024-05-21 LAB — TROPONIN T, HIGH SENSITIVITY: Troponin T High Sensitivity: <15 ng/L (ref 0–19)

## 2024-05-21 NOTE — Telephone Encounter (Signed)
 FYI Only or Action Required?: FYI only for provider.  Patient was last seen in primary care on 10/18/2023 by Jimmy Charlie FERNS, MD.  Called Nurse Triage reporting Palpitations.  Symptoms began several days ago.  Interventions attempted: Nothing.  Symptoms are: gradually worsening.  Triage Disposition: Go to ED Now  Patient/caregiver understands and will follow disposition?: Yes    Copied from CRM #[id number]. Topic: Clinical - Red Word Triage >> May 21, 2024  8:28 AM Berneda FALCON wrote: Red Word that prompted transfer to Nurse Triage: Pt states she thought she was getting a cold. Took Cold Ease for 3 days because household is sick but she did not have symptoms of a cold, and now is experiencing heart palpitations since Saturday.   Symptoms:Weakness, Jittery, feels that heart is skipping a beat Answer Assessment - Initial Assessment Questions Patient states due to these symptoms, she did not drink coffee or take her multivitamin. She denies getting any sickness from her household. She took Bromelain and zinc supplement. Denies having history of A-Fib or abnormal rhythm.  Patient states yesterday she was having back pain yesterday. Denies chest pain/pressure, arm pain. Patient denies dizziness/lightheadedness.. Patient states while currently checking HR she can feel it skipping beats. Patient's mother has history of A-Fib.   1. DESCRIPTION: Please describe your heart rate or heartbeat that you are having (e.g., fast/slow, regular/irregular, skipped or extra beats, palpitations)     Patient states it feels like its skipping a beat. Her and her husband were placing their finger on her wrist and noticed it was skipping a beat 2. ONSET: When did it start? (e.g., minutes, hours, days)      Saturday 4. PATTERN Does it come and go, or has it been constant since it started?  Does it get worse with exertion?   Are you feeling it now?     Comes and goes 6. HEART RATE: Can you tell me  your heart rate? How many beats in 15 seconds?  Note: Not all patients can do this.       Patient states her HR has been elevated. 7. RECURRENT SYMPTOM: Have you ever had this before? If Yes, ask: When was the last time? and What happened that time?      No 8. CAUSE: What do you think is causing the palpitations?     Patient is uncertain. 9. CARDIAC HISTORY: Do you have any history of heart disease? (e.g., heart attack, angina, bypass surgery, angioplasty, arrhythmia)      No 10. OTHER SYMPTOMS: Do you have any other symptoms? (e.g., dizziness, chest pain, sweating, difficulty breathing)       Patient states I do not have as much energy as I would normally have. Jittery  Protocols used: Heart Rate and Heartbeat Questions-A-AH

## 2024-05-21 NOTE — ED Triage Notes (Signed)
 Pt c/o heart skipping a beat that started Saturday. Denies SOB but states she feels breathy when it happens. No cardiac hx. Has been taking supplements for cold recently.

## 2024-05-21 NOTE — ED Notes (Addendum)
 Pt discharged home and given discharge paperwork. Opportunities given for questions. Pt verbalizes understanding. PIV removed x1. Bethena Powell SAUNDERS , RN

## 2024-05-21 NOTE — Discharge Instructions (Addendum)
 Return for any new or worse symptoms.  Make an appointment to follow-up with [facility] cardiology.  Can also follow-up with your regular doctor.  Workup here today without any acute findings.  Other than the occasional premature ventricular contraction.

## 2024-05-21 NOTE — ED Provider Notes (Addendum)
 [place] EMERGENCY DEPARTMENT AT [facility] Provider Note   CSN: [id number] Arrival date & time: 05/21/24  9061     Patient presents with: Palpitations   Kristen Schwartz is a 53 y.o. female.   Patient with the feeling of palpitations sometimes felt like heart was going fast since Saturday.  Denies any shortness of breath or chest pain.  Patient did have some tremors in the past questionable arrhythmia was on meta propyl all.  Not on anything currently.  Taking some zinc supplements for an upper respiratory infection that is improving.  Temp here 98.2 pulse 78 respiration 16 blood pressure 144/90 and oxygen saturation 99% on room air.  Cardiac monitor shows an occasional PVC.  Will continue to monitor to see if there is anything additional.  Patient currently does not have a cardiologist past medical history significant for migraines recurrent UTI gastroesophageal reflux disease.  Past surgical history significant for gallbladder removal.  Patient has never used tobacco products.       Prior to Admission medications   Medication Sig Start Date End Date Taking? Authorizing Provider  metoprolol  succinate (TOPROL -XL) 25 MG 24 hr tablet Take 0.5 tablets (12.5 mg total) by mouth every other day. 08/01/23   Rilla Baller, MD  Multiple Vitamin (MULTIVITAMIN WITH MINERALS) TABS tablet Take 1 tablet by mouth daily.    [provider]  Multiple Vitamins-Minerals (OCUVITE PO) Take by mouth daily.    [provider]  polyethylene glycol powder (GLYCOLAX/MIRALAX) 17 GM/SCOOP powder Take 1 Container by mouth once.    [provider]  Probiotic Product (ALIGN PO) Take by mouth daily.    [provider]  QUERCETIN PO Take by mouth daily. Plus Bromeain    [provider]    Allergies: Codeine sulfate    Review of Systems  Constitutional:  Negative for chills and fever.  HENT:  Negative for ear pain and sore throat.   Eyes:  Negative for pain  and visual disturbance.  Respiratory:  Negative for cough and shortness of breath.   Cardiovascular:  Positive for palpitations. Negative for chest pain and leg swelling.  Gastrointestinal:  Negative for abdominal pain and vomiting.  Genitourinary:  Negative for dysuria and hematuria.  Musculoskeletal:  Negative for arthralgias and back pain.  Skin:  Negative for color change and rash.  Neurological:  Negative for seizures and syncope.  All other systems reviewed and are negative.   Updated Vital Signs BP (!) 144/90 (BP Location: Right Arm)   Pulse 78   Temp 98.2 F (36.8 C) (Oral)   Resp 16   SpO2 99%   Physical Exam Vitals and nursing note reviewed.  Constitutional:      General: She is not in acute distress.    Appearance: Normal appearance. She is well-developed.  HENT:     Head: Normocephalic and atraumatic.  Eyes:     Extraocular Movements: Extraocular movements intact.     Conjunctiva/sclera: Conjunctivae normal.     Pupils: Pupils are equal, round, and reactive to light.  Cardiovascular:     Rate and Rhythm: Normal rate and regular rhythm.     Heart sounds: No murmur heard. Pulmonary:     Effort: Pulmonary effort is normal. No respiratory distress.     Breath sounds: Normal breath sounds. No stridor. No wheezing, rhonchi or rales.  Abdominal:     Palpations: Abdomen is soft.     Tenderness: There is no abdominal tenderness.  Musculoskeletal:  General: No swelling.     Cervical back: Neck supple.     Right lower leg: No edema.     Left lower leg: No edema.  Skin:    General: Skin is warm and dry.     Capillary Refill: Capillary refill takes less than 2 seconds.  Neurological:     Mental Status: She is alert.  Psychiatric:        Mood and Affect: Mood normal.     (all labs ordered are listed, but only abnormal results are displayed) Labs Reviewed  CBC WITH DIFFERENTIAL/PLATELET  BASIC METABOLIC PANEL WITH GFR  TROPONIN T, HIGH SENSITIVITY     EKG: EKG Interpretation Date/Time:  Monday May 21 2024 09:47:45 EDT Ventricular Rate:  77 PR Interval:  141 QRS Duration:  88 QT Interval:  359 QTC Calculation: 407 R Axis:   16  Text Interpretation: Sinus rhythm Abnormal R-wave progression, early transition Borderline T abnormalities, anterior leads Confirmed by Vinny Taranto [phone]) on 05/21/2024 9:53:00 AM  Radiology: No results found.   Procedures   Medications Ordered in the ED - No data to display                                  Medical Decision Making Amount and/or Complexity of Data Reviewed Labs: ordered. Radiology: ordered.   Cardiac monitor shows an occasional PVC.  Will continue cardiac monitoring.  CBC white count normal hemoglobin normal.  Basic metabolic panel pending and chest x-ray pending.  Will add on troponins.  Not concerned about pulmonary embolus not tachycardic and oxygen saturations in the upper 90s.  Patient probably will require follow-up with cardiology.  Lab workup here troponin less than 15 CBC normal.  Basic metabolic panel blood sugar 103 but otherwise normal renal function normal chest x-ray negative.  Cardiac monitoring here with occasional PVCs.  Will have patient follow-up with [facility] cardiology.  Final diagnoses:  Palpitations    ED Discharge Orders     None          Geraldene Hamilton, MD 05/21/24 1027    Geraldene Hamilton, MD 05/21/24 1515

## 2024-05-23 ENCOUNTER — Inpatient Hospital Stay: Admitting: Family Medicine

## 2024-05-28 ENCOUNTER — Ambulatory Visit (INDEPENDENT_AMBULATORY_CARE_PROVIDER_SITE_OTHER): Admitting: Family Medicine

## 2024-05-28 ENCOUNTER — Encounter: Payer: Self-pay | Admitting: Family Medicine

## 2024-05-28 VITALS — BP 122/82 | HR 65 | Temp 98.0°F | Ht 66.0 in | Wt 143.5 lb

## 2024-05-28 DIAGNOSIS — R109 Unspecified abdominal pain: Secondary | ICD-10-CM | POA: Diagnosis not present

## 2024-05-28 DIAGNOSIS — R002 Palpitations: Secondary | ICD-10-CM

## 2024-05-28 LAB — POC URINALSYSI DIPSTICK (AUTOMATED)
Bilirubin, UA: NEGATIVE
Blood, UA: NEGATIVE
Glucose, UA: NEGATIVE
Ketones, UA: NEGATIVE
Leukocytes, UA: NEGATIVE
Nitrite, UA: NEGATIVE
Protein, UA: NEGATIVE
Spec Grav, UA: 1.01 (ref 1.010–1.025)
Urobilinogen, UA: 0.2 U/dL
pH, UA: 6 (ref 5.0–8.0)

## 2024-05-28 LAB — HEPATIC FUNCTION PANEL
ALT: 15 U/L (ref 0–35)
AST: 19 U/L (ref 0–37)
Albumin: 4.5 g/dL (ref 3.5–5.2)
Alkaline Phosphatase: 87 U/L (ref 39–117)
Bilirubin, Direct: 0.1 mg/dL (ref 0.0–0.3)
Total Bilirubin: 0.6 mg/dL (ref 0.2–1.2)
Total Protein: 6.7 g/dL (ref 6.0–8.3)

## 2024-05-28 LAB — CK: Total CK: 58 U/L (ref 17–177)

## 2024-05-28 LAB — TSH: TSH: 0.88 u[IU]/mL (ref 0.35–5.50)

## 2024-05-28 NOTE — Progress Notes (Unsigned)
 Ph: [phone] Fax: [phone]   Patient ID: Kristen Schwartz, female    DOB: [birth year], 53 y.o.   MRN: [id number]  This visit was conducted in person.  BP 122/82   Pulse 65   Temp 98 F (36.7 C) (Oral)   Ht 5' 6 (1.676 m)   Wt 143 lb 8 oz (65.1 kg)   SpO2 99%   BMI 23.16 kg/m    CC: ER f/u visit  Subjective:   HPI: Kristen Schwartz is a 53 y.o. female presenting on 05/28/2024 for Follow-up   Recent ER visit for tachy-palpitations in setting of recent URI. Records reviewed. Cardiac monitor showed occasional PVC. Labs including CBC and troponin were normal. EKG showed delayed R wave progression, normal sinus rhythm. After overall reassuring ER evaluation, discharged with planned cardiology f/u - appt scheduled for 06/19/2024. She restarted old prescription of Toprol  XL 12.5mg  daily.   She had a Holter monitor performed 11/2019 - was normal (sinus bradycardia, lowest HR 54).   She also had other associated symptoms coming up - low back pain, tightness/tremor sensation to R back, increased burping that started within the past 1 week.  Notes ongoing internal vibration sensation - more noticeable at rest.  Had some fatigue last week, but that has improved.  Heating pad may have helped this morning.   She fully stopped coffee (just 1 cup/day).  She has been taking miralax with benefit.   Notes chronic R side abdominal discomfort for 15 yrs, tongue stinging sensation as well. Chronic side pain worse with prolonged sitting/standing, better with laying supine or walking/active, better early morning when she wakes up. Notes worse with tight garments to chest or abdomen.  No h/o shingles to this area.  LMP age 30 yo.  H/o cholecystectomy 2011 for same RUQ discomfort symptoms, with mild improvement after this but persistent discomfort.  Latest contrasted CT abd/pelvis reviewed from 2022 - no cause found. Fibroids noted. Liver normal, s/p cholecystectomy  Daughter and husband recently  sick.  She had recently restarted quercetin along with MVI and zinc lozenge supplements for 3 days prior to symptoms - has since stopped zinc.  Lab Results  Component Value Date   TSH 0.88 05/28/2024  Denies heat/cold intolerance, new diarrhea or constipation, skin or hair changes, unexpected weight changes.  No nausea/vomiting, reflux, GERD or GI symptoms besides increased belching.      Relevant past medical, surgical, family and social history reviewed and updated as indicated. Interim medical history since our last visit reviewed. Allergies and medications reviewed and updated. Outpatient Medications Prior to Visit  Medication Sig Dispense Refill   Multiple Vitamin (MULTIVITAMIN WITH MINERALS) TABS tablet Take 1 tablet by mouth daily.     Multiple Vitamins-Minerals (OCUVITE PO) Take by mouth daily.     polyethylene glycol powder (GLYCOLAX/MIRALAX) 17 GM/SCOOP powder Take 1 Container by mouth once.     Probiotic Product (ALIGN PO) Take by mouth daily.     QUERCETIN PO Take by mouth daily. Plus Bromeain     metoprolol  succinate (TOPROL -XL) 25 MG 24 hr tablet Take 0.5 tablets (12.5 mg total) by mouth every other day.     No facility-administered medications prior to visit.     Per HPI unless specifically indicated in ROS section below Review of Systems  Objective:  BP 122/82   Pulse 65   Temp 98 F (36.7 C) (Oral)   Ht 5' 6 (1.676 m)   Wt 143 lb 8 oz (65.1 kg)  SpO2 99%   BMI 23.16 kg/m   Wt Readings from Last 3 Encounters:  05/28/24 143 lb 8 oz (65.1 kg)  11/25/23 143 lb (64.9 kg)  10/28/23 143 lb (64.9 kg)      Physical Exam Vitals and nursing note reviewed.  Constitutional:      Appearance: Normal appearance. She is not ill-appearing.  HENT:     Head: Normocephalic and atraumatic.     Mouth/Throat:     Mouth: Mucous membranes are moist.     Pharynx: Oropharynx is clear. No oropharyngeal exudate or posterior oropharyngeal erythema.  Eyes:     Extraocular  Movements: Extraocular movements intact.     Conjunctiva/sclera: Conjunctivae normal.     Pupils: Pupils are equal, round, and reactive to light.  Neck:     Thyroid : No thyroid  mass or thyromegaly.  Cardiovascular:     Rate and Rhythm: Normal rate and regular rhythm. Occasional Extrasystoles are present.    Pulses: Normal pulses.     Heart sounds: Normal heart sounds. No murmur heard. Pulmonary:     Effort: Pulmonary effort is normal. No respiratory distress.     Breath sounds: Normal breath sounds. No wheezing, rhonchi or rales.  Abdominal:     General: Bowel sounds are normal. There is no distension.     Palpations: Abdomen is soft. There is no mass.     Tenderness: There is no abdominal tenderness. There is no right CVA tenderness, left CVA tenderness, guarding or rebound.     Hernia: No hernia is present.  Musculoskeletal:     Cervical back: Normal range of motion and neck supple.     Right lower leg: No edema.     Left lower leg: No edema.     Comments:  No pain midline spine No paraspinous mm tenderness  Lymphadenopathy:     Cervical: No cervical adenopathy.  Skin:    General: Skin is warm and dry.     Findings: No rash.  Neurological:     Mental Status: She is alert.  Psychiatric:        Mood and Affect: Mood normal.        Behavior: Behavior normal.       Results for orders placed or performed in visit on 05/28/24  POCT Urinalysis Dipstick (Automated)   Collection Time: 05/28/24 10:28 AM  Result Value Ref Range   Color, UA yellow    Clarity, UA clear    Glucose, UA Negative Negative   Bilirubin, UA negative    Ketones, UA negative    Spec Grav, UA 1.010 1.010 - 1.025   Blood, UA negative    pH, UA 6.0 5.0 - 8.0   Protein, UA Negative Negative   Urobilinogen, UA 0.2 0.2 or 1.0 E.U./dL   Nitrite, UA negative    Leukocytes, UA Negative Negative  TSH   Collection Time: 05/28/24 10:32 AM  Result Value Ref Range   TSH 0.88 0.35 - 5.50 uIU/mL  CK    Collection Time: 05/28/24 10:32 AM  Result Value Ref Range   Total CK 58 17 - 177 U/L  Hepatic function panel   Collection Time: 05/28/24 10:32 AM  Result Value Ref Range   Total Bilirubin 0.6 0.2 - 1.2 mg/dL   Bilirubin, Direct 0.1 0.0 - 0.3 mg/dL   Alkaline Phosphatase 87 39 - 117 U/L   AST 19 0 - 37 U/L   ALT 15 0 - 35 U/L   Total Protein 6.7 6.0 -  8.3 g/dL   Albumin 4.5 3.5 - 5.2 g/dL   Lab Results  Component Value Date   NA 142 05/21/2024   CL 104 05/21/2024   K 4.2 05/21/2024   CO2 27 05/21/2024   BUN 13 05/21/2024   CREATININE 0.93 05/21/2024   GFRNONAA >60 05/21/2024   CALCIUM 9.6 05/21/2024   ALBUMIN 4.5 05/28/2024   GLUCOSE 103 (H) 05/21/2024    Lab Results  Component Value Date   WBC 7.0 05/21/2024   HGB 13.0 05/21/2024   HCT 38.5 05/21/2024   MCV 94.1 05/21/2024   PLT 296 05/21/2024   Assessment & Plan:   Problem List Items Addressed This Visit     Right sided abdominal pain   Chronic, longstanding, of unclear cause. ?MSK given positional component, ?referred thoracic pain.  Will update LFTs, TSH, UA.  Discussed measures to avoid extra air.  Rec hold probiotic at this time. Discussed OTC gasX, pepcid use.       Relevant Orders   TSH (Completed)   CK (Completed)   Hepatic function panel (Completed)   POCT Urinalysis Dipstick (Automated) (Completed)   Palpitations - Primary   Ongoing palpitations. ER labs were reassuring. Update TSH.  Will order Zio patch for further characterization of palpitations. She will keep cardiology appt for next month, hopeful to have Zio patch results by then.  Refilled Toprol  XL 12.5mg  daily.       Relevant Orders   LONG TERM MONITOR (3-14 DAYS)     Meds ordered this encounter  Medications   metoprolol  succinate (TOPROL -XL) 25 MG 24 hr tablet    Sig: Take 0.5 tablets (12.5 mg total) by mouth every other day.    Dispense:  45 tablet    Refill:  0    Orders Placed This Encounter  Procedures   TSH   CK    Hepatic function panel   LONG TERM MONITOR (3-14 DAYS)    Standing Status:   Future    Number of Occurrences:   1    Expiration Date:   05/29/2025    Where should this test be performed?:   [facility]    Does the patient have an implanted cardiac device?:   No    Prescribed days of wear:   14    Type of enrollment:   Home Enrollment    Vendor::   [employer]    Reason for Exam:   Palpitations R00.2   POCT Urinalysis Dipstick (Automated)    Patient Instructions  Labs and urine test today I will order 2 week heart monitor  Keep cardiology appointment For belching - avoid straw use, hold probiotic for now, use simethicone or GasX OTC, use pepcid OTC for next 1-2 weeks.   Follow up plan: Return if symptoms worsen or fail to improve.  Anton Blas, MD

## 2024-05-28 NOTE — Patient Instructions (Addendum)
 Labs and urine test today I will order 2 week heart monitor  Keep cardiology appointment For belching - avoid straw use, hold probiotic for now, use simethicone or GasX OTC, use pepcid OTC for next 1-2 weeks.

## 2024-05-29 ENCOUNTER — Ambulatory Visit: Attending: Family Medicine

## 2024-05-29 ENCOUNTER — Ambulatory Visit: Payer: Self-pay | Admitting: Family Medicine

## 2024-05-29 DIAGNOSIS — R002 Palpitations: Secondary | ICD-10-CM

## 2024-05-29 MED ORDER — METOPROLOL SUCCINATE ER 25 MG PO TB24: 12.5000 mg | ORAL_TABLET | ORAL | 0 refills | Status: DC

## 2024-05-29 NOTE — Assessment & Plan Note (Addendum)
 Chronic, longstanding, of unclear cause. ?MSK given positional component, ?referred thoracic pain.  Will update LFTs, TSH, UA.  Discussed measures to avoid extra air.  Rec hold probiotic at this time. Discussed OTC gasX, pepcid use.

## 2024-05-29 NOTE — Assessment & Plan Note (Addendum)
 Ongoing palpitations. ER labs were reassuring. Update TSH.  Will order Zio patch for further characterization of palpitations. She will keep cardiology appt for next month, hopeful to have Zio patch results by then.  Refilled Toprol  XL 12.5mg  daily.

## 2024-06-08 ENCOUNTER — Telehealth: Payer: Self-pay | Admitting: *Deleted

## 2024-06-08 ENCOUNTER — Ambulatory Visit

## 2024-06-08 NOTE — Progress Notes (Unsigned)
 l

## 2024-06-08 NOTE — Telephone Encounter (Signed)
 Pt states when called for PV appointment that she is currently wearing a heart monitor due to abnormal heart rythmx which she states she went to ER recently for. Has CARD MD OV Oct 15th. RN informed pt that if she is having heart issues that she will need to have Cardiac clearance to have colonoscopy. Pt denies any urgency/s/s for colonoscopy at this time states its just time to do it. RN to cancel PV and procedure with pts consent. Instructed pt that if she does have a cardiac issue that is discovered she may have to have a cardiac clearance prior to reschedule of colonoscopy. Pt states she understands. Suggested pt review womens cardiac s/s for her safety and health. PT states she will. Gave [facility] main # for her to call and reschedule once issues with her heart are address. No other questions at this time.

## 2024-06-18 NOTE — Progress Notes (Unsigned)
 Cardiology Office Note  Date:  06/19/2024   ID:  DEZTINY Schwartz, DOB Oct 17, 1970, MRN [id number]  PCP:  Rilla Baller, MD   Chief Complaint  Patient presents with   New Patient (Initial Visit)    Ref by Dr. Rilla for palpitations. The patient was at [facility] hospital 05/21/2024. Patient c/o lightheaded, palpitations, quivering in chest has muscle weakness/tiredness in arms, legs and neck.     HPI:  Kristen Schwartz a 53 y.o. femalewith past medical history of: Hx of tremor/tight muscle better with massage started 2021 Who presents by referral from Dr. Rilla for palpitations  Previously seen by cardiology, Dr. Claudene May 2021 At that time she reported history of intermittent heart rate and tremor that occurs spontaneously lasting minutes up to half hour Symptoms felt to be noncardiac, likely secondary to anxiety with hyperadrenergic component leading to tremor and increased heart rate  She reports symptoms seem to resolve for some time but have recurred  Sx restarted sept 2025, tremor Restarted metoprolol  succinate 12.5 daily Stopped caffeine Does not feel it driving, better symptoms when active such as walking Sx on the left side of her body, muscle cramping, tightness, nerve type feeling, crosses over some to the right  Seen in the emergency room May 21, 2024 with palpitations, rare PVC noted no other significant arrhythmia ER workup negative  Zio monitor reviewed on today's visit, Patch Wear Time:  13 days and 11 hours (2025-09-25T19:54:58-0400 to 2025-10-09T07:04:47-0400)   Zio monitor 2-week Normal sinus rhythm rare PVCs, no other significant arrhythmia noted Triggered events associated with normal sinus rhythm rare PVC  EKG personally reviewed by myself on todays visit EKG Interpretation Date/Time:  Tuesday June 19 2024 15:47:17 EDT Ventricular Rate:  74 PR Interval:  134 QRS Duration:  72 QT Interval:  358 QTC Calculation: 397 R Axis:   1  Text  Interpretation: Normal sinus rhythm Nonspecific ST and T wave abnormality When compared with ECG of 21-May-2024 09:47, No significant change was found Confirmed by Perla Lye [phone]) on 06/19/2024 3:55:58 PM    PMH:   has a past medical history of GERD (gastroesophageal reflux disease), H/O: pneumonia, Migraines, Recurrent UTI, and Uterine fibroid.  PSH:    Past Surgical History:  Procedure Laterality Date   GALLBLADDER SURGERY     none     UPPER GASTROINTESTINAL ENDOSCOPY     WISDOM TOOTH EXTRACTION      Current Outpatient Medications  Medication Sig Dispense Refill   Multiple Vitamin (MULTIVITAMIN WITH MINERALS) TABS tablet Take 1 tablet by mouth daily.     Multiple Vitamins-Minerals (OCUVITE PO) Take by mouth daily.     polyethylene glycol powder (GLYCOLAX/MIRALAX) 17 GM/SCOOP powder Take 1 Container by mouth once.     Probiotic Product (ALIGN PO) Take by mouth daily.     QUERCETIN PO Take by mouth daily. Plus Bromeain     metoprolol  succinate (TOPROL -XL) 25 MG 24 hr tablet Take 0.5 tablets (12.5 mg total) by mouth daily as needed. 30 tablet 1   No current facility-administered medications for this visit.     Allergies:   Codeine sulfate   Social History:  The patient  reports that she has never smoked. She has never used smokeless tobacco. She reports that she does not drink alcohol and does not use drugs.   Family History:   family history includes Cancer in her mother; Colon cancer in her paternal aunt; Colon polyps in her father; Mitral valve prolapse in her mother.  Review of Systems: Review of Systems  Constitutional: Negative.   HENT: Negative.    Respiratory: Negative.    Cardiovascular: Negative.   Gastrointestinal: Negative.   Musculoskeletal:  Positive for myalgias.  Neurological:  Positive for tremors.  Psychiatric/Behavioral: Negative.    All other systems reviewed and are negative.   PHYSICAL EXAM: VS:  BP 110/70 (BP Location: Right Arm, Patient  Position: Sitting, Cuff Size: Normal)   Pulse 74   Ht 5' 5.5 (1.664 m)   Wt 144 lb 8 oz (65.5 kg)   SpO2 98%   BMI 23.68 kg/m  , BMI Body mass index is 23.68 kg/m. GEN: Well nourished, well developed, in no acute distress HEENT: normal Neck: no JVD, carotid bruits, or masses Cardiac: RRR; no murmurs, rubs, or gallops,no edema  Respiratory:  clear to auscultation bilaterally, normal work of breathing GI: soft, nontender, nondistended, + BS MS: no deformity or atrophy Skin: warm and dry, no rash Neuro:  Strength and sensation are intact Psych: euthymic mood, full affect   Recent Labs: 05/21/2024: BUN 13; Creatinine, Ser 0.93; Hemoglobin 13.0; Platelets 296; Potassium 4.2; Sodium 142 05/28/2024: ALT 15; TSH 0.88    Lipid Panel Lab Results  Component Value Date   CHOL 163 04/28/2021   HDL 55.50 04/28/2021   LDLCALC 86 04/28/2021   TRIG 107.0 04/28/2021      Wt Readings from Last 3 Encounters:  06/19/24 144 lb 8 oz (65.5 kg)  05/28/24 143 lb 8 oz (65.1 kg)  11/25/23 143 lb (64.9 kg)       ASSESSMENT AND PLAN:  Problem List Items Addressed This Visit     Palpitations - Primary   Relevant Orders   EKG 12-Lead (Completed)   Other Visit Diagnoses       Tremor         Muscle tightness          Etiology of symptoms is unclear Does not appear to be associated with cardiac arrhythmia as detailed by Zio monitor Rare PVCs likely unrelated to muscle tightness, tremor feeling which is constant relieved when she is doing something active - At rest appreciates tremor - Recommend she talk with primary care, she might need a trial of medication such as Lyrica or Cymbalta even gabapentin - She will try B12 supplement No significant improvement on metoprolol , magnesium Echocardiogram likely of little clinical benefit given normal clinical exam and normal EKG, atypical presentation  Seen in consultation for Dr. Rilla and will be referred back to his office for ongoing  care of the issues detailed above  Signed, Velinda Lunger, M.D., Ph.D. [facility] [place], [place] [phone]

## 2024-06-19 ENCOUNTER — Ambulatory Visit: Attending: Cardiovascular Disease | Admitting: Cardiovascular Disease

## 2024-06-19 ENCOUNTER — Encounter: Payer: Self-pay | Admitting: Cardiovascular Disease

## 2024-06-19 VITALS — BP 110/70 | HR 74 | Ht 65.5 in | Wt 144.5 lb

## 2024-06-19 DIAGNOSIS — M6289 Other specified disorders of muscle: Secondary | ICD-10-CM | POA: Diagnosis not present

## 2024-06-19 DIAGNOSIS — R251 Tremor, unspecified: Secondary | ICD-10-CM

## 2024-06-19 DIAGNOSIS — R002 Palpitations: Secondary | ICD-10-CM | POA: Diagnosis not present

## 2024-06-19 MED ORDER — METOPROLOL SUCCINATE ER 25 MG PO TB24: 12.5000 mg | ORAL_TABLET | Freq: Every day | ORAL | 1 refills | Status: DC | PRN

## 2024-06-19 NOTE — Patient Instructions (Addendum)

## 2024-06-20 DIAGNOSIS — R002 Palpitations: Secondary | ICD-10-CM

## 2024-06-20 NOTE — Telephone Encounter (Signed)
 Spoke with patient. OV scheduled for 12/3. Colonoscopy has been scheduled for 12/19 pending cardiac clearance.

## 2024-06-20 NOTE — Telephone Encounter (Signed)
 Inbound call from patient stating that she advised her cardiologist that we needed cardiac clearance and they stated that we would have to request the clearance for her. Patient is requesting we do that so she can reschedule procedure.  Provided me with number telephone number [phone]. Please advise.

## 2024-06-20 NOTE — Telephone Encounter (Signed)
 Pod A,   Please see attached messages. Can someone reach out and obtain cardiac clearance for this patient. It looks like she was seen by cardiology yesterday.   Thank you

## 2024-06-22 ENCOUNTER — Encounter: Admitting: Gastroenterology

## 2024-06-26 ENCOUNTER — Encounter: Payer: Self-pay | Admitting: Family Medicine

## 2024-06-29 NOTE — Telephone Encounter (Signed)
   CRM # [id number] Owner: None Status: Unresolved Open  Priority: Routine Created on: 06/29/2024 08:18 AM By: Katrinka Robinson HERO   Primary Information  Source  Kristen Schwartz (Patient)   Subject  Kristen Schwartz (Patient)   Topic  General - Other    Communication  Reason for CRM: Patient calling following up on message sent to provider in MyChart, states she wants to know next steps still having issues agent offered to schedule patient and she declined        [place] [phone]

## 2024-07-02 ENCOUNTER — Ambulatory Visit: Payer: Self-pay

## 2024-07-02 NOTE — Telephone Encounter (Signed)
 Pt does not have blood pressure no HR concerns at this time. Pt calling to ask what next steps should be as she is still having the trembling that she has addressed with her PCP. Pt states they are not better, nor are they worse. Please reach out with next steps.   Copied from CRM [id number]. Topic: Clinical - Red Word Triage >> Jul 02, 2024 11:45 AM Pinkey ORN wrote: Red Word that prompted transfer to Nurse Triage: Low Blood Pressure >> Jul 02, 2024 11:47 AM Pinkey ORN wrote: Patient mentions that her blood pressure is extremely low but her heart rate is high. Patient also mentions trembling within the inside of her body. Patient states she sent over a mychart message in regards to these symptoms as well as some muscle tightening but hasn't gotten a response back.

## 2024-07-03 NOTE — Telephone Encounter (Signed)
 Replied via my chart message.

## 2024-07-04 ENCOUNTER — Telehealth: Payer: Self-pay | Admitting: Family Medicine

## 2024-07-04 NOTE — Telephone Encounter (Signed)
 Copied from CRM [id number]. Topic: Referral - Question >> Jul 04, 2024 10:05 AM Mesmerise C wrote: Reason for CRM: Patient inquiring if needing to come in to discuss referral for neurologist and having MRI done she scheduled an appt on 11/4 just in case she needed to be seen

## 2024-07-05 ENCOUNTER — Other Ambulatory Visit: Payer: Self-pay | Admitting: Cardiovascular Disease

## 2024-07-05 MED ORDER — METOPROLOL SUCCINATE ER 25 MG PO TB24: 12.5000 mg | ORAL_TABLET | Freq: Every day | ORAL | 1 refills | Status: DC | PRN

## 2024-07-06 MED ORDER — GABAPENTIN 100 MG PO CAPS: ORAL_CAPSULE | ORAL | 0 refills | Status: DC

## 2024-07-06 NOTE — Addendum Note (Signed)
 Addended by: RILLA BALLER on: 07/06/2024 09:22 AM   Modules accepted: Orders

## 2024-07-06 NOTE — Telephone Encounter (Signed)
 Called patient states she she started taking Metoprolol  15 mg every other night to help with tremors. When she started taking that she started keeping up with her blood pressure. Noticed at night when laying around her blood pressure is reading between 99/64 and 100/65. She denies any symptoms at all. She states when she checks through the day it is normal. She was concerned with starting gabapentin with readings so low. Advised to hold off on any changes in medications until seen in office. She will not start gabapentin at this time. I have reviewed red words over the weekend she will go to [facility] or ED. Advised patient to keep log of readings and list what she was doing when checking and what time she took her Metoprolol . She agreed. She is aware that pcp is out of the office today. She was not able to check bp at this time but feels fine.

## 2024-07-09 NOTE — Telephone Encounter (Signed)
Will see then. 

## 2024-07-10 ENCOUNTER — Encounter: Payer: Self-pay | Admitting: Family Medicine

## 2024-07-10 ENCOUNTER — Ambulatory Visit (INDEPENDENT_AMBULATORY_CARE_PROVIDER_SITE_OTHER): Admitting: Family Medicine

## 2024-07-10 VITALS — BP 102/82 | HR 75 | Temp 98.2°F | Ht 65.5 in | Wt 142.0 lb

## 2024-07-10 DIAGNOSIS — R209 Unspecified disturbances of skin sensation: Secondary | ICD-10-CM | POA: Diagnosis not present

## 2024-07-10 DIAGNOSIS — M6289 Other specified disorders of muscle: Secondary | ICD-10-CM | POA: Insufficient documentation

## 2024-07-10 DIAGNOSIS — G629 Polyneuropathy, unspecified: Secondary | ICD-10-CM | POA: Diagnosis not present

## 2024-07-10 DIAGNOSIS — H6120 Impacted cerumen, unspecified ear: Secondary | ICD-10-CM | POA: Insufficient documentation

## 2024-07-10 DIAGNOSIS — H6121 Impacted cerumen, right ear: Secondary | ICD-10-CM | POA: Diagnosis not present

## 2024-07-10 NOTE — Assessment & Plan Note (Addendum)
 Difficult to pinpoint given description but possibly describing neuropathy symptoms to left chest, left forearm, left leg.  Will start eval with labwork (B12 , B1, ESR), and refer to neurology for further evaluation  She agrees with plan.  Symptoms worse in the mornings, but no true morning stiffness- check ANA.  No recent tick bites.

## 2024-07-10 NOTE — Assessment & Plan Note (Signed)
 Ongoing difficulty over the past 6 weeks. Reassuring nonfocal neurological exam. Update labs as per above, refer to neurology for further evaluation.  She is worried about MS given family history.

## 2024-07-10 NOTE — Assessment & Plan Note (Addendum)
 S/p R ear irrigation followed by cerumen disimpaction with several large pieces of wax removed however cerumen impaction remains.  Offered ENT referral - she opts to continue working on this at home with debrox/mineral oil and will return for reattempt.

## 2024-07-10 NOTE — Progress Notes (Signed)
 Ph: [phone] Fax: [phone]   Patient ID: Kristen Schwartz, female    DOB: Oct 22, 1970, 53 y.o.   MRN: [id number]  This visit was conducted in person.  BP 102/82   Pulse 75   Temp 98.2 F (36.8 C) (Oral)   Ht 5' 5.5 (1.664 m)   Wt 142 lb (64.4 kg)   SpO2 99%   BMI 23.27 kg/m    CC: tremors  Subjective:   HPI: Kristen Schwartz is a 53 y.o. female presenting on 07/10/2024 for Medical Management of Chronic Issues (Pt here to discuss referral /Pt would also like her ears cleaned today.)   Saw cardiology Dr Perla - overall reassuring evaluation. This was in f/u for ER visit for tachy-palpitations in setting of URI, EKG showed rare PVCs. Zio monitor - HR 48-154, rare SVT, few PAC/PVCs without sustained arrhythmia.  H/o low blood pressures - metoprolol  caused worsening of this - so she has stopped BB. She started magnesium this did help muscle tightness.  I prescribed gabapentin 100mg  at night for tremors - she hasn't tried this yet, was worried about side effects.   Recent labwork reassuring - CPK, LFTs, urinalysis, TSH.  No results found for: VITAMINB12  Lab Results  Component Value Date   TSH 0.88 05/28/2024    Lab Results  Component Value Date   CKTOTAL 58 05/28/2024    Lab Results  Component Value Date   WBC 7.0 05/21/2024   HGB 13.0 05/21/2024   HCT 38.5 05/21/2024   MCV 94.1 05/21/2024   PLT 296 05/21/2024    Lab Results  Component Value Date   NA 142 05/21/2024   CL 104 05/21/2024   K 4.2 05/21/2024   CO2 27 05/21/2024   BUN 13 05/21/2024   CREATININE 0.93 05/21/2024   GFRNONAA >60 05/21/2024   CALCIUM 9.6 05/21/2024   ALBUMIN 4.5 05/28/2024   GLUCOSE 103 (H) 05/21/2024    Lab Results  Component Value Date   ALT 15 05/28/2024   AST 19 05/28/2024   ALKPHOS 87 05/28/2024   BILITOT 0.6 05/28/2024    6 wk h/o internal shaking vibration feeling predominantly to left side of body centered around chest with radiation of feeling down to left  calf/leg. Some tingling to R anterior calf. Symptoms at their worst when she wakes up in the morning (myalgia/sore muscles like she just exercised). Laying supine causes numb feeling. Some cervical muscle tightness feeling as well as throughout spine.   No fevers/chills, no visible tremors, no burning pain, no neck pain or back pain per se, no shooting pain down legs, no vision changes, slurred speech, or one sided weakness. No new rash. No am stiffness. No memory changes. No fatigue or tick bites.   She started taking vitamin b12 1000mcg 5d ago, unsure if it has helped.  No alcohol use.  She has been walking more and cutting down on sugar.     Relevant past medical, surgical, family and social history reviewed and updated as indicated. Interim medical history since our last visit reviewed. Allergies and medications reviewed and updated. Outpatient Medications Prior to Visit  Medication Sig Dispense Refill   cyanocobalamin (VITAMIN B12) 1000 MCG tablet Take 1,000 mcg by mouth daily.     gabapentin (NEURONTIN) 100 MG capsule Take 1 capsule (100 mg total) by mouth at bedtime for 4 days, THEN 1 capsule (100 mg total) 2 (two) times daily for 4 days, THEN 1 capsule (100 mg total) 3 (three) times  daily. 100 capsule 0   Multiple Vitamin (MULTIVITAMIN WITH MINERALS) TABS tablet Take 1 tablet by mouth daily.     Multiple Vitamins-Minerals (OCUVITE PO) Take by mouth daily.     OVER THE COUNTER MEDICATION Take 250 mcg by mouth daily.     polyethylene glycol powder (GLYCOLAX/MIRALAX) 17 GM/SCOOP powder Take 1 Container by mouth once.     Probiotic Product (ALIGN PO) Take by mouth daily.     metoprolol  succinate (TOPROL -XL) 25 MG 24 hr tablet Take 0.5 tablets (12.5 mg total) by mouth daily as needed. 30 tablet 1   QUERCETIN PO Take by mouth daily. Plus Bromeain     No facility-administered medications prior to visit.     Per HPI unless specifically indicated in ROS section below Review of  Systems  Objective:  BP 102/82   Pulse 75   Temp 98.2 F (36.8 C) (Oral)   Ht 5' 5.5 (1.664 m)   Wt 142 lb (64.4 kg)   SpO2 99%   BMI 23.27 kg/m   Wt Readings from Last 3 Encounters:  07/10/24 142 lb (64.4 kg)  06/19/24 144 lb 8 oz (65.5 kg)  05/28/24 143 lb 8 oz (65.1 kg)      Physical Exam Vitals and nursing note reviewed.  Constitutional:      Appearance: Normal appearance. She is not ill-appearing.  HENT:     Head: Normocephalic and atraumatic.     Right Ear: External ear normal. Decreased hearing noted. There is impacted cerumen.     Left Ear: Hearing, tympanic membrane, ear canal and external ear normal.     Mouth/Throat:     Mouth: Mucous membranes are moist.     Pharynx: Oropharynx is clear. No oropharyngeal exudate or posterior oropharyngeal erythema.  Eyes:     Extraocular Movements: Extraocular movements intact.     Conjunctiva/sclera: Conjunctivae normal.     Pupils: Pupils are equal, round, and reactive to light.  Cardiovascular:     Rate and Rhythm: Normal rate and regular rhythm.     Pulses: Normal pulses.     Heart sounds: Normal heart sounds. No murmur heard. Pulmonary:     Effort: Pulmonary effort is normal. No respiratory distress.     Breath sounds: Normal breath sounds. No wheezing, rhonchi or rales.  Musculoskeletal:     Cervical back: Normal range of motion and neck supple.     Right lower leg: No edema.     Left lower leg: No edema.  Skin:    General: Skin is warm and dry.     Findings: No rash.  Neurological:     General: No focal deficit present.     Mental Status: She is alert and oriented to person, place, and time. Mental status is at baseline.     Cranial Nerves: Cranial nerves 2-12 are intact.     Sensory: Sensation is intact.     Motor: Motor function is intact.     Coordination: Coordination is intact. Romberg sign negative. Coordination normal.     Gait: Gait is intact.     Comments:  CN 2-12 intact FTN intact EOMI 5/5  strength BUE, BLE Sensation intact No pronator drift  Psychiatric:        Mood and Affect: Mood normal.        Behavior: Behavior normal.       Results for orders placed or performed in visit on 05/28/24  POCT Urinalysis Dipstick (Automated)   Collection Time: 05/28/24 10:28 AM  Result Value Ref Range   Color, UA yellow    Clarity, UA clear    Glucose, UA Negative Negative   Bilirubin, UA negative    Ketones, UA negative    Spec Grav, UA 1.010 1.010 - 1.025   Blood, UA negative    pH, UA 6.0 5.0 - 8.0   Protein, UA Negative Negative   Urobilinogen, UA 0.2 0.2 or 1.0 E.U./dL   Nitrite, UA negative    Leukocytes, UA Negative Negative  TSH   Collection Time: 05/28/24 10:32 AM  Result Value Ref Range   TSH 0.88 0.35 - 5.50 uIU/mL  CK   Collection Time: 05/28/24 10:32 AM  Result Value Ref Range   Total CK 58 17 - 177 U/L  Hepatic function panel   Collection Time: 05/28/24 10:32 AM  Result Value Ref Range   Total Bilirubin 0.6 0.2 - 1.2 mg/dL   Bilirubin, Direct 0.1 0.0 - 0.3 mg/dL   Alkaline Phosphatase 87 39 - 117 U/L   AST 19 0 - 37 U/L   ALT 15 0 - 35 U/L   Total Protein 6.7 6.0 - 8.3 g/dL   Albumin 4.5 3.5 - 5.2 g/dL    Assessment & Plan:   Problem List Items Addressed This Visit     Disturbance, sensation, skin   Ongoing difficulty over the past 6 weeks. Reassuring nonfocal neurological exam. Update labs as per above, refer to neurology for further evaluation.  She is worried about MS given family history.       Relevant Orders   ANA   Neuropathy - Primary   Difficult to pinpoint given description but possibly describing neuropathy symptoms to left chest, left forearm, left leg.  Will start eval with labwork (B12 , B1, ESR), and refer to neurology for further evaluation  She agrees with plan.  Symptoms worse in the mornings, but no true morning stiffness- check ANA.  No recent tick bites.       Relevant Orders   Magnesium   Vitamin B12   Vitamin B1    Ambulatory referral to Neurology   Sedimentation rate   ANA   Muscle tightness   Predominantly to neck muscles - this is better since starting daily magnesium 250mg  - ok to continue this. Update Mg levels.      Relevant Orders   ANA   Cerumen impaction   S/p R ear irrigation followed by cerumen disimpaction with several large pieces of wax removed however cerumen impaction remains.  Offered ENT referral - she opts to continue working on this at home with debrox/mineral oil and will return for reattempt.         No orders of the defined types were placed in this encounter.   Orders Placed This Encounter  Procedures   Magnesium   Vitamin B12   Vitamin B1   Sedimentation rate   ANA   Ambulatory referral to Neurology    Referral Priority:   Routine    Referral Type:   Consultation    Referral Reason:   Specialty Services Required    Requested Specialty:   Neurology    Number of Visits Requested:   1    Patient Instructions  Labs today  Right ear wash today  Ok to try gabapentin at night.  I will also refer you to neurologist for further evaluation.   Follow up plan: Return if symptoms worsen or fail to improve.  Anton Blas, MD

## 2024-07-10 NOTE — Assessment & Plan Note (Signed)
 Predominantly to neck muscles - this is better since starting daily magnesium 250mg  - ok to continue this. Update Mg levels.

## 2024-07-10 NOTE — Patient Instructions (Signed)
 Labs today  Right ear wash today  Ok to try gabapentin at night.  I will also refer you to neurologist for further evaluation.

## 2024-07-11 LAB — MAGNESIUM: Magnesium: 2 mg/dL (ref 1.5–2.5)

## 2024-07-11 LAB — SEDIMENTATION RATE: Sed Rate: 10 mm/h (ref 0–30)

## 2024-07-12 LAB — VITAMIN B12: Vitamin B-12: 922 pg/mL — ABNORMAL HIGH (ref 211–911)

## 2024-07-12 NOTE — Telephone Encounter (Signed)
 Seen, discussed at OV. Stay off BB.

## 2024-07-13 ENCOUNTER — Encounter: Payer: Self-pay | Admitting: Family Medicine

## 2024-07-13 NOTE — Telephone Encounter (Signed)
 Copied from CRM [id number]. Topic: Clinical - Request for Lab/Test Order >> Jul 13, 2024  8:40 AM Kristen Schwartz wrote: Reason for CRM: Patient states tests show she has a autoimmune disease and was referred to a neurologist ,but the neurologist can't see her for 6 months. And requests to have a MRI performed sooner, and  wants to know if the clinic can perform that . Patient request a return call concerning this

## 2024-07-14 LAB — ANTI-NUCLEAR AB-TITER (ANA TITER)
ANA TITER: 1:40 {titer} — ABNORMAL HIGH
ANA Titer 1: 1:40 {titer} — ABNORMAL HIGH

## 2024-07-14 LAB — ANA: Anti Nuclear Antibody (ANA): POSITIVE — AB

## 2024-07-14 LAB — VITAMIN B1: Vitamin B1 (Thiamine): 19 nmol/L (ref 8–30)

## 2024-07-16 ENCOUNTER — Ambulatory Visit: Payer: Self-pay

## 2024-07-16 ENCOUNTER — Ambulatory Visit: Payer: Self-pay | Admitting: Family Medicine

## 2024-07-16 NOTE — Telephone Encounter (Unsigned)
 Copied from CRM [id number]. Topic: Clinical - Request for Lab/Test Order >> Jul 13, 2024  8:40 AM Roselie BROCKS wrote: Reason for CRM: Patient states tests show she has a autoimmune disease and was referred to a neurologist ,but the neurologist can't see her for 6 months. And requests to have a MRI performed sooner, and  wants to know if the clinic can perform that . Patient request a return call concerning this >> Jul 16, 2024 10:00 AM Thersia BROCKS wrote: Patient called in would like a followup regarding this , would like a callback  [phone]

## 2024-07-16 NOTE — Telephone Encounter (Signed)
 FYI Only or Action Required?: Action required by provider: referral request and lab or test result follow-up needed.  Patient was last seen in primary care on 07/10/2024 by Rilla Baller, MD.  Called Nurse Triage reporting Shaking.  Symptoms began several months ago.  Interventions attempted: Prescription medications: gabapentin with minimal relief.  Symptoms are: unchanged.  Triage Disposition: Call PCP Within 24 Hours  Patient/caregiver understands and will follow disposition?: No, wishes to speak with PCP       Reason for Disposition  Muscle jerks, tics, or shudders are a chronic symptom (recurrent or ongoing AND present > 4 weeks)  [1] Follow-up call from patient regarding patient's clinical status AND [2] information NON-URGENT    Pt has chronic shivering and saw recent test results (specifically ANA) on MyChart. Pt would like to discuss with PCP, as Neuro cannot see her for 5 months.  Pt unsure if alternate Neuro referral is necessary to get her seen sooner or if PCP would like to order MRI until she can be seen by specialist.  Triager will forward encounter for Dr Rilla 's office to review and advise. Patient verbalized understanding and is expecting call back from office for next steps.  Answer Assessment - Initial Assessment Questions 1. APPEARANCE of MOVEMENT: What did the jerking or twitching look like? (e.g., body area)     Body trembling on the inside, L>R. Has seen PCP x 2 for this sx 2. ONSET: When did this start happening? (e.g., hours, days, weeks, months ago)     2 mos 3. DURATION: How long does the jerk, twitch, or spasm last?     Continuous x 2 mos 4. FREQUENCY:  How often does this happen?      See above 5. WHEN: When does this happen? (e.g., while awake, while falling asleep, while sleeping)     While awake, mainly while trying to fall asleep 6. CAUSE: What do you think caused the sx?     unsure 7. OTHER SYMPTOMS: Are there any other  symptoms? (e.g., fever, headache)     denies 8. PREGNANCY: Is there any chance you are pregnant? When was your last menstrual period?     N/a  Protocols used: Muscle Jerks - Tics - Shudders-A-AH, PCP Call - No Triage-A-AH

## 2024-07-16 NOTE — Telephone Encounter (Signed)
 Spoke with patient. Tried gabapentin with sedation but didn't help symptoms.  Worse symptoms last night.  Ongoing internal tremors, abnormal skin sensation of heat when body is compressed - anterior thighs vs back into neck. Muscles feel sore. Occ neck and thigh soreness. No rash or skin changes, skin is not warm to touch.  Walking helps when experiencing these symptoms.

## 2024-07-17 NOTE — Telephone Encounter (Signed)
 This is a duplicate request as I spoke with patient yesterday at 5:45pm about this.

## 2024-07-25 DIAGNOSIS — R208 Other disturbances of skin sensation: Secondary | ICD-10-CM | POA: Diagnosis not present

## 2024-07-25 DIAGNOSIS — R251 Tremor, unspecified: Secondary | ICD-10-CM | POA: Diagnosis not present

## 2024-07-31 ENCOUNTER — Other Ambulatory Visit: Payer: Self-pay | Admitting: Neurology

## 2024-07-31 DIAGNOSIS — R208 Other disturbances of skin sensation: Secondary | ICD-10-CM

## 2024-07-31 DIAGNOSIS — R251 Tremor, unspecified: Secondary | ICD-10-CM

## 2024-07-31 DIAGNOSIS — Z820 Family history of epilepsy and other diseases of the nervous system: Secondary | ICD-10-CM

## 2024-08-03 ENCOUNTER — Ambulatory Visit
Admission: RE | Admit: 2024-08-03 | Discharge: 2024-08-03 | Disposition: A | Discharge status: Home / Self Care | Source: Ambulatory Visit | Attending: Neurology | Admitting: Neurology

## 2024-08-03 DIAGNOSIS — Z820 Family history of epilepsy and other diseases of the nervous system: Secondary | ICD-10-CM | POA: Insufficient documentation

## 2024-08-03 DIAGNOSIS — R930 Abnormal findings on diagnostic imaging of skull and head, not elsewhere classified: Secondary | ICD-10-CM | POA: Diagnosis not present

## 2024-08-03 DIAGNOSIS — R208 Other disturbances of skin sensation: Secondary | ICD-10-CM | POA: Diagnosis not present

## 2024-08-03 DIAGNOSIS — R251 Tremor, unspecified: Secondary | ICD-10-CM | POA: Insufficient documentation

## 2024-08-08 ENCOUNTER — Ambulatory Visit: Admitting: Nurse Practitioner

## 2024-08-08 ENCOUNTER — Telehealth: Payer: Self-pay

## 2024-08-08 ENCOUNTER — Encounter: Payer: Self-pay | Admitting: Nurse Practitioner

## 2024-08-08 VITALS — BP 110/72 | HR 77 | Ht 65.5 in | Wt 143.0 lb

## 2024-08-08 DIAGNOSIS — Z01818 Encounter for other preprocedural examination: Secondary | ICD-10-CM | POA: Diagnosis not present

## 2024-08-08 DIAGNOSIS — Z860101 Personal history of adenomatous and serrated colon polyps: Secondary | ICD-10-CM | POA: Diagnosis not present

## 2024-08-08 DIAGNOSIS — Z860100 Personal history of colon polyps, unspecified: Secondary | ICD-10-CM

## 2024-08-08 MED ORDER — NA SULFATE-K SULFATE-MG SULF 17.5-3.13-1.6 GM/177ML PO SOLN: 1.0000 | Freq: Once | ORAL | 0 refills | Status: AC

## 2024-08-08 NOTE — Telephone Encounter (Signed)
 [facility] Pre-operative Risk Assessment     Request for surgical clearance:     Endoscopy Procedure  What type of surgery is being performed?     COLONOSCOPY   When is this surgery scheduled?     [id number]  What type of clearance is required ?  CARDIAC  Are there any medications that need to be held prior to surgery and how long? N/A  Practice name and name of physician performing surgery? DR GLENDIA STACIA [facility]  What is your office phone and fax number?      Phone- [phone]  Fax- [phone]  Anesthesia type (None, local, MAC, general) ?       MAC   Please route your response to Ayahna Solazzo, CMA   THANKS

## 2024-08-08 NOTE — Patient Instructions (Addendum)
 You have been scheduled for a colonoscopy. Please follow written instructions given to you at your visit today.   If you use inhalers (even only as needed), please bring them with you on the day of your procedure.  DO NOT TAKE 7 DAYS PRIOR TO TEST- Trulicity (dulaglutide) Ozempic, Wegovy (semaglutide) Mounjaro, Zepbound (tirzepatide) Bydureon Bcise (exanatide extended release)  DO NOT TAKE 1 DAY PRIOR TO YOUR TEST Rybelsus (semaglutide) Adlyxin (lixisenatide) Victoza (liraglutide) Byetta (exanatide) ___________________________________________________________________________   Thank you for entrusting me with your care and for choosing [employer], [place], CRNP   _______________________________________________________  If your blood pressure at your visit was 140/90 or greater, please contact your primary care physician to follow up on this.  _______________________________________________________  If you are age 55 or older, your body mass index should be between 23-30. Your Body mass index is 23.43 kg/m. If this is out of the aforementioned range listed, please consider follow up with your Primary Care Provider.  If you are age 21 or younger, your body mass index should be between 19-25. Your Body mass index is 23.43 kg/m. If this is out of the aformentioned range listed, please consider follow up with your Primary Care Provider.   ________________________________________________________  The [facility] providers would like to encourage you to use MYCHART to communicate with providers for non-urgent requests or questions.  Due to long hold times on the telephone, sending your provider a message by [facility] may be a faster and more efficient way to get a response.  Please allow 48 business hours for a response.  Please remember that this is for non-urgent requests.  _______________________________________________________  [facility] is using a  team-based approach to care.  Your team is made up of your doctor and two to three APPS. Our APPS (Nurse Practitioners and Physician Assistants) work with your physician to ensure care continuity for you. They are fully qualified to address your health concerns and develop a treatment plan. They communicate directly with your gastroenterologist to care for you. Seeing the Advanced Practice Practitioners on your physician's team can help you by facilitating care more promptly, often allowing for earlier appointments, access to diagnostic testing, procedures, and other specialty referrals.

## 2024-08-08 NOTE — Telephone Encounter (Signed)
   Patient Name: Kristen Schwartz  DOB: Jan 07, 1971 MRN: [id number]  Primary Cardiologist: None  Chart reviewed as part of pre-operative protocol coverage. Per Dr. Gollan, Acceptable risk for colonoscopy. No further cardiac testing needed. Thx TGollan  I will route this recommendation to the requesting party via Epic fax function and remove from pre-op pool.  Please call with questions.  Damien JAYSON Braver, NP 08/08/2024, 3:58 PM

## 2024-08-08 NOTE — Progress Notes (Addendum)
 08/08/2024 Kristen Schwartz [id number] [birth year]   CHIEF COMPLAINT: Schedule a colonoscopy  HISTORY OF PRESENT ILLNESS: Kristen Schwartz is a 53 year old female with a past medical history of migraine headaches, palpitations, neuropathy and tremors undergoing neuro evaluation. She is known by Dr. Stacia.  She underwent a colonoscopy 11/25/2023 which showed focal inflamed mucosa in the descending colon and diverticulosis to the sigmoid colon.  Biopsies of the descending colon showed abnormal appearing mucosa consistent with a tubular adenoma which was not expected as mucosal changes did not look like a distinct polyp.  Repeat colonoscopy in 6 months to reassess the area of abnormal mucosa/tubular adenoma was recommended.  She presents today to schedule a colonoscopy.  She was previously having palpitations and pretesting says she required cardiac clearance and office visit prior to proceeding with a colonoscopy.  She endorsed having generalized burning discomfort, tremors and palpitations which started 05/2024.  She was seen by cardiologist Dr. Gollan 06/19/2024 who assessed her symptoms were unlikely associate with cardiac arrhythmia as a Zio monitor did not identify any arrhythmia, she had rare PVCs.  She started taking magnesium and her palpitations abated.  She is undergoing neurological evaluation for possible MS.  She underwent a brain MRI on Friday, 09/02/2024, results are pending.  She denies having any nausea or vomiting.  No upper or lower abdominal pain.  Prior constipation resolved after she started taking magnesium as noted above.  No bloody or black stools.  No GERD symptoms or dysphagia.     Latest Ref Rng & Units 05/21/2024   10:11 AM 04/28/2021   11:56 AM 04/23/2021   12:45 PM  CBC  WBC 4.0 - 10.5 K/uL 7.0  5.9  7.2   Hemoglobin 12.0 - 15.0 g/dL 86.9  86.8  84.7   Hematocrit 36.0 - 46.0 % 38.5  39.0  43.9   Platelets 150 - 400 K/uL 296  276.0  281        Latest Ref Rng & Units  05/28/2024   10:32 AM 05/21/2024   10:11 AM 04/28/2021   11:56 AM  CMP  Glucose 70 - 99 mg/dL  896  79   BUN 6 - 20 mg/dL  13  15   Creatinine 9.55 - 1.00 mg/dL  9.06  9.13   Sodium 864 - 145 mmol/L  142  140   Potassium 3.5 - 5.1 mmol/L  4.2  4.0   Chloride 98 - 111 mmol/L  104  101   CO2 22 - 32 mmol/L  27  30   Calcium 8.9 - 10.3 mg/dL  9.6  9.3   Total Protein 6.0 - 8.3 g/dL 6.7   6.4   Total Bilirubin 0.2 - 1.2 mg/dL 0.6   0.7   Alkaline Phos 39 - 117 U/L 87   94   AST 0 - 37 U/L 19   23   ALT 0 - 35 U/L 15   42     Colonoscopy 11/25/2023: - Focal inflamed mucosa in the descending colon. Biopsied. This may be bowel prep related. - Moderate diverticulosis in the sigmoid colon, in the descending colon and in the ascending colon. There was narrowing of the colon in association with the diverticular opening. - The distal rectum and anal verge are normal on retroflexion view.  - Repeat colonoscopy in 6 months as the abnormal mucosa in the descending colon did not appear to be a distinct polyp 1. Surgical [P], colon, descending :       -  TUBULAR ADENOMA (1 FRAGMENTS)       -  MULTIPLE FRAGMENTS OF BENIGN COLONIC MUCOSA       -  NO HIGH-GRADE DYSPLASIA OR MALIGNANCY IDENTIFIED  EGD 09/28/2010 by [facility]: Multiple gastric polyps   Past Medical History:  Diagnosis Date   Allergy    codiene   GERD (gastroesophageal reflux disease)    H/O: pneumonia    Migraines    Recurrent UTI    Uterine fibroid    Past Surgical History:  Procedure Laterality Date   CHOLECYSTECTOMY  2011   GALLBLADDER SURGERY     none     UPPER GASTROINTESTINAL ENDOSCOPY     WISDOM TOOTH EXTRACTION      Allergies  Allergen Reactions   Codeine Sulfate Nausea And Vomiting    REACTION: gi upset      Outpatient Encounter Medications as of 08/08/2024  Medication Sig   cyanocobalamin  (VITAMIN B12) 1000 MCG tablet Take 1,000 mcg by mouth daily.   gabapentin  (NEURONTIN ) 100 MG capsule Take 1 capsule (100  mg total) by mouth at bedtime for 4 days, THEN 1 capsule (100 mg total) 2 (two) times daily for 4 days, THEN 1 capsule (100 mg total) 3 (three) times daily.   Multiple Vitamin (MULTIVITAMIN WITH MINERALS) TABS tablet Take 1 tablet by mouth daily.   Multiple Vitamins-Minerals (OCUVITE PO) Take by mouth daily.   OVER THE COUNTER MEDICATION Take 250 mcg by mouth daily.   polyethylene glycol powder (GLYCOLAX/MIRALAX) 17 GM/SCOOP powder Take 1 Container by mouth once.   Probiotic Product (ALIGN PO) Take by mouth daily.   No facility-administered encounter medications on file as of 08/08/2024.    REVIEW OF SYSTEMS:  Gen: Denies fever, sweats or chills. No weight loss.  CV: See HPI.  Denies chest pain or shortness of breath. Resp: Denies cough, shortness of breath of hemoptysis.  GI:See HPI. GU: Denies urinary burning, blood in urine, increased urinary frequency or incontinence. MS: Denies joint pain, muscles aches or weakness. Derm: Denies rash, itchiness, skin lesions or unhealing ulcers. Psych: Denies depression, anxiety, memory loss or confusion. Heme: Denies bruising, easy bleeding. Neuro: See HPI. Endo:  Denies any problems with DM, thyroid  or adrenal function.  PHYSICAL EXAM: BP 110/72   Pulse 77   Ht 5' 5.5 (1.664 m)   Wt 143 lb (64.9 kg)   BMI 23.43 kg/m   General: 53 year old female in no acute distress. Head: Normocephalic and atraumatic. Eyes:  Sclerae non-icteric, conjunctive pink. Ears: Normal auditory acuity. Mouth: Dentition intact. No ulcers or lesions.  Neck: Supple, no lymphadenopathy or thyromegaly.  Lungs: Clear bilaterally to auscultation without wheezes, crackles or rhonchi. Heart: Regular rate and rhythm. No murmur, rub or gallop appreciated.  Abdomen: Soft, nontender, nondistended. No masses. No hepatosplenomegaly. Normoactive bowel sounds x 4 quadrants.  Rectal: Deferred. Musculoskeletal: Symmetrical with no gross deformities. Skin: Warm and dry. No rash  or lesions on visible extremities. Extremities: No edema. Neurological: Alert oriented x 4, no focal deficits.  Psychological: Alert and cooperative. Normal mood and affect.  ASSESSMENT AND PLAN:  53 year old female who underwent a colonoscopy 11/25/2023 which showed focal inflamed mucosa in the descending colon and diverticulosis to the sigmoid colon. Biopsies of the descending colon showed abnormal appearing mucosa consistent with a tubular adenoma which was not expected as mucosal changes did not look like a distinct polyp. Repeat colonoscopy in 6 months to reassess the area of abnormal mucosa/tubular adenoma was recommended.  - Colonoscopy benefits and risks  discussed including risk with sedation, risk of bleeding, perforation and infection  - Cardiac clearance requested per pre-testing prior to the patient proceeding with a colonoscopy due to having palpitations Sep - Oct 2025 which have abated   Palpitations, abated after patient started magnesium supplementation.  Zio monitor 9/25 - 06/14/2024 did not identify any significant arrhythmia.  No chest pain, dizziness or shortness of breath.   Tremor/burning sensation undergoing neuroevaluation.  Brain MRI to rule out MS performed 09/02/2024, results pending.- - Follow-up with neurology   ADDENDUM: CARDIAC CLEARANCE RECEIVED 08/08/2024 AS FOLLOWS: Chart reviewed as part of pre-operative protocol coverage. Per Dr. Gollan, Acceptable risk for colonoscopy. No further cardiac testing needed. Thx TGollan   I will route this recommendation to the requesting party via Epic fax function and remove from pre-op pool.   Please call with questions.   Damien JAYSON Braver, NP 08/08/2024, 3:58 PM   CC:  Rilla Baller, MD

## 2024-08-08 NOTE — Telephone Encounter (Signed)
 Dr. Gollan You saw this patient 06/19/24 with symptoms not felt cardiac in nature and referred her back to PCP. We are now asked for cardiac risk assessment prior to colonoscopy. Given she was seen within the last 60 days, can you please provide recommendations on proceeding with procedure?

## 2024-08-09 NOTE — Telephone Encounter (Signed)
 Called and let patient know she has been cleared to have her procedure.

## 2024-08-13 ENCOUNTER — Telehealth: Payer: Self-pay | Admitting: Family Medicine

## 2024-08-13 NOTE — Telephone Encounter (Signed)
 Copied from CRM #[id number]. Topic: Clinical - Request for Lab/Test Order >> Aug 13, 2024 11:03 AM Alfonso ORN wrote: Reason for CRM: pt called to f/u on MRI referral that mentioned white matter. She is wanting to do a test that checks for several different cancers: gallery test and is ok with insurance not covering. Also would like to confirm if she did a protein check. [phone] (M)

## 2024-08-13 NOTE — Telephone Encounter (Signed)
 Called patient she is aware that Dr. Lane will call about the MRI as he was the one that ordered it. She is calling about getting a Galleri or a similar genetic testing. She is ok with referral to hematology

## 2024-08-17 ENCOUNTER — Other Ambulatory Visit: Payer: Self-pay | Admitting: Medical Genetics

## 2024-08-17 ENCOUNTER — Encounter: Payer: Self-pay | Admitting: Gastroenterology

## 2024-08-20 ENCOUNTER — Inpatient Hospital Stay
Admission: RE | Admit: 2024-08-20 | Discharge: 2024-08-20 | Payer: Self-pay | Attending: Medical Genetics | Admitting: Medical Genetics

## 2024-08-20 ENCOUNTER — Telehealth: Payer: Self-pay

## 2024-08-20 DIAGNOSIS — R251 Tremor, unspecified: Secondary | ICD-10-CM | POA: Diagnosis not present

## 2024-08-20 DIAGNOSIS — R208 Other disturbances of skin sensation: Secondary | ICD-10-CM | POA: Diagnosis not present

## 2024-08-20 MED ORDER — MAGNESIUM 250 MG PO CAPS: 1.0000 | ORAL_CAPSULE | Freq: Every day | ORAL | Status: AC

## 2024-08-20 NOTE — Addendum Note (Signed)
 Addended by: RILLA BALLER on: 08/20/2024 06:07 PM   Modules accepted: Orders

## 2024-08-20 NOTE — Telephone Encounter (Signed)
 Copied from CRM #[id number]. 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 She stated that if so could he send a referral out? Prefer [place] area

## 2024-08-20 NOTE — Telephone Encounter (Addendum)
 Looks like she's already gotten tested through [facility]. Spoke with patient. Recommended against Galleri test due to cost. Will await [facility].

## 2024-08-20 NOTE — Telephone Encounter (Addendum)
 Spoke with patient.  Saw Dr Lane - rec gabapentin , hasn't started regularly taking yet.  MRI was reassuring, pending NCS/EMG.   Persistent internal burning sensation to back and trunk, some to thighs. Recommend try gabapentin   Don't think she needs rheum eval at this time.

## 2024-08-21 NOTE — Progress Notes (Signed)
 Agree with the assessment and plan as outlined by Alcide Evener, NP.    Zae Kirtz E. Tomasa Rand, MD [facility]

## 2024-08-24 ENCOUNTER — Encounter: Payer: Self-pay | Admitting: Gastroenterology

## 2024-08-24 ENCOUNTER — Ambulatory Visit: Admitting: Gastroenterology

## 2024-08-24 VITALS — BP 117/68 | HR 71 | Temp 98.1°F | Resp 11 | Ht 65.0 in | Wt 143.0 lb

## 2024-08-24 DIAGNOSIS — K635 Polyp of colon: Secondary | ICD-10-CM

## 2024-08-24 DIAGNOSIS — K621 Rectal polyp: Secondary | ICD-10-CM | POA: Diagnosis not present

## 2024-08-24 DIAGNOSIS — Z860100 Personal history of colon polyps, unspecified: Secondary | ICD-10-CM | POA: Diagnosis not present

## 2024-08-24 DIAGNOSIS — K5730 Diverticulosis of large intestine without perforation or abscess without bleeding: Secondary | ICD-10-CM | POA: Diagnosis not present

## 2024-08-24 DIAGNOSIS — Z1211 Encounter for screening for malignant neoplasm of colon: Secondary | ICD-10-CM

## 2024-08-24 DIAGNOSIS — D128 Benign neoplasm of rectum: Secondary | ICD-10-CM

## 2024-08-24 DIAGNOSIS — D125 Benign neoplasm of sigmoid colon: Secondary | ICD-10-CM

## 2024-08-24 MED ORDER — SODIUM CHLORIDE 0.9 % IV SOLN: 500.0000 mL | INTRAVENOUS | Status: DC

## 2024-08-24 NOTE — Progress Notes (Signed)
 [facility] History and Physical   Primary Care Physician:  Rilla Baller, MD   Reason for Procedure:   Follow up adenoma  Plan:    Colonoscopy   HPI: Kristen Schwartz is a 53 y.o. female undergoing repeat colonoscopy.  She underwent a screening colonoscopy in March of this year and was found to have focal areas of erythema in the descending colon thought to represent focal colitis.  This was biopsied and pathology revealed adenomatous tissue.  Repeat colonoscopy to reassess and completely remove any suspected adenomas.  The patient was provided an opportunity to ask questions and all were answered. The patient agreed with the plan   Past Medical History:  Diagnosis Date   Allergy    codiene   GERD (gastroesophageal reflux disease)    H/O: pneumonia    Migraines    Recurrent UTI    Uterine fibroid     Past Surgical History:  Procedure Laterality Date   CHOLECYSTECTOMY  2011   GALLBLADDER SURGERY     UPPER GASTROINTESTINAL ENDOSCOPY     WISDOM TOOTH EXTRACTION      Prior to Admission medications  Medication Sig Start Date End Date Taking? Authorizing Provider  gabapentin  (NEURONTIN ) 100 MG capsule Take 100 mg twice daily for one week, then increase to 200 mg twice daily 08/20/24  Yes [provider]  Magnesium  250 MG CAPS Take 1 capsule by mouth daily. 08/20/24   Rilla Baller, MD  Multiple Vitamin (MULTIVITAMIN WITH MINERALS) TABS tablet Take 1 tablet by mouth daily.    [provider]  Multiple Vitamins-Minerals (OCUVITE PO) Take by mouth daily.    [provider]  OVER THE COUNTER MEDICATION Take 250 mcg by mouth daily.    [provider]  Probiotic Product (ALIGN PO) Take by mouth daily.    [provider]    Current Outpatient Medications  Medication Sig Dispense Refill   gabapentin  (NEURONTIN ) 100 MG capsule Take 100 mg twice daily for one week, then increase to 200 mg twice daily     Magnesium  250 MG  CAPS Take 1 capsule by mouth daily.     Multiple Vitamin (MULTIVITAMIN WITH MINERALS) TABS tablet Take 1 tablet by mouth daily.     Multiple Vitamins-Minerals (OCUVITE PO) Take by mouth daily.     OVER THE COUNTER MEDICATION Take 250 mcg by mouth daily.     Probiotic Product (ALIGN PO) Take by mouth daily.     Current Facility-Administered Medications  Medication Dose Route Frequency Provider Last Rate Last Admin   0.9 %  sodium chloride  infusion  500 mL Intravenous Continuous Stacia Glendia BRAVO, MD        Allergies as of 08/24/2024 - Review Complete 08/24/2024  Allergen Reaction Noted   Codeine sulfate Nausea Only 07/25/2024    Family History  Problem Relation Age of Onset   Cancer Mother        Multiple myeloma   Mitral valve prolapse Mother    Heart disease Mother    Colon polyps Father    Colon cancer Paternal Aunt    Arthritis Maternal Grandfather    Heart disease Maternal Grandfather    Heart disease Maternal Grandmother    Cancer Paternal Grandmother    Early death Paternal Aunt    Cancer Maternal Uncle    Cancer Paternal Uncle    Cancer Paternal Aunt    Diabetes Paternal Aunt    Cancer Paternal Aunt    Diabetes Paternal Aunt  Esophageal cancer Neg Hx    Rectal cancer Neg Hx    Stomach cancer Neg Hx    Breast cancer Neg Hx     Social History   Socioeconomic History   Marital status: Married    Spouse name: Not on file   Number of children: 0   Years of education: Not on file   Highest education level: Not on file  Occupational History   Not on file  Tobacco Use   Smoking status: Never   Smokeless tobacco: Never  Vaping Use   Vaping status: Never Used  Substance and Sexual Activity   Alcohol use: No   Drug use: Never   Sexual activity: Not Currently    Birth control/protection: Post-menopausal  Other Topics Concern   Not on file  Social History Narrative   Lives with husband and daughter    Occ: gaffer    Social Drivers of Health    Tobacco Use: Low Risk (08/24/2024)   Patient History    Smoking Tobacco Use: Never    Smokeless Tobacco Use: Never    Passive Exposure: Not on file  Financial Resource Strain: Low Risk  (07/25/2024)   Received from [facility]   Overall Financial Resource Strain (CARDIA)    Difficulty of Paying Living Expenses: Not very hard  Food Insecurity: No Food Insecurity (07/25/2024)   Received from [facility]   Epic    Within the past 12 months, you worried that your food would run out before you got the money to buy more.: Never true    Within the past 12 months, the food you bought just didn't last and you didn't have money to get more.: Never true  Transportation Needs: No Transportation Needs (07/25/2024)   Received from [facility] - Transportation    In the past 12 months, has lack of transportation kept you from medical appointments or from getting medications?: No    Lack of Transportation (Non-Medical): No  Physical Activity: Not on file  Stress: Not on file  Social Connections: Not on file  Intimate Partner Violence: Not on file  Depression (PHQ2-9): Low Risk (07/10/2024)   Depression (PHQ2-9)    PHQ-2 Score: 0  Alcohol Screen: Not on file  Housing: Low Risk  (07/25/2024)   Received from [facility]   Epic    In the last 12 months, was there a time when you were not able to pay the mortgage or rent on time?: No    In the past 12 months, how many times have you moved where you were living?: 0    At any time in the past 12 months, were you homeless or living in a shelter (including now)?: No  Utilities: Not At Risk (07/25/2024)   Received from [facility]   Epic    In the past 12 months has the electric, gas, oil, or water company threatened to shut off services in your home?: No  Health Literacy: Not on file    Review of Systems:  All other review of systems negative  except as mentioned in the HPI.  Physical Exam: Vital signs BP 102/65   Pulse 82   Temp 98.1 F (36.7 C)   Ht 5' 5 (1.651 m)   Wt 143 lb (64.9 kg)   SpO2 98%   BMI 23.80 kg/m   General:   Alert,  Well-developed, well-nourished, pleasant and cooperative in NAD  Airway:  Mallampati 3 Lungs:  Clear throughout to auscultation.   Heart:  Regular rate and rhythm; no murmurs, clicks, rubs,  or gallops. Abdomen:  Soft, nontender and nondistended. Normal bowel sounds.   Neuro/Psych:  Normal mood and affect. A and O x 3   Willis Holquin E. Stacia, MD [facility]

## 2024-08-24 NOTE — Progress Notes (Signed)
 Called to room to assist during endoscopic procedure.  Patient ID and intended procedure confirmed with present staff. Received instructions for my participation in the procedure from the performing physician.

## 2024-08-24 NOTE — Progress Notes (Signed)
 Sedate, gd SR, tolerated procedure well, VSS, report to RN

## 2024-08-24 NOTE — Patient Instructions (Signed)
 Handouts given: Polyps Resume previous diet. Continue present medications.  Await pathology results. Repeat colonoscopy in 5 years for surveillance.  YOU HAD AN ENDOSCOPIC PROCEDURE TODAY AT THE [facility]:   Refer to the procedure report that was given to you for any specific questions about what was found during the examination.  If the procedure report does not answer your questions, please call your gastroenterologist to clarify.  If you requested that your care partner not be given the details of your procedure findings, then the procedure report has been included in a sealed envelope for you to review at your convenience later.  YOU SHOULD EXPECT: Some feelings of bloating in the abdomen. Passage of more gas than usual.  Walking can help get rid of the air that was put into your GI tract during the procedure and reduce the bloating. If you had a lower endoscopy (such as a colonoscopy or flexible sigmoidoscopy) you may notice spotting of blood in your stool or on the toilet paper. If you underwent a bowel prep for your procedure, you may not have a normal bowel movement for a few days.  Please Note:  You might notice some irritation and congestion in your nose or some drainage.  This is from the oxygen used during your procedure.  There is no need for concern and it should clear up in a day or so.  SYMPTOMS TO REPORT IMMEDIATELY:  Following lower endoscopy (colonoscopy or flexible sigmoidoscopy):  Excessive amounts of blood in the stool  Significant tenderness or worsening of abdominal pains  Swelling of the abdomen that is new, acute  Fever of 100F or higher  For urgent or emergent issues, a gastroenterologist can be reached at any hour by calling [phone]. Do not use MyChart messaging for urgent concerns.    DIET:  We do recommend a small meal at first, but then you may proceed to your regular diet.  Drink plenty of fluids but you should avoid alcoholic beverages  for 24 hours.  ACTIVITY:  You should plan to take it easy for the rest of today and you should NOT DRIVE or use heavy machinery until tomorrow (because of the sedation medicines used during the test).    FOLLOW UP: Our staff will call the number listed on your records the next business day following your procedure.  We will call around 7:15- 8:00 am to check on you and address any questions or concerns that you may have regarding the information given to you following your procedure. If we do not reach you, we will leave a message.     If any biopsies were taken you will be contacted by phone or by letter within the next 1-3 weeks.  Please call us  at [phone] if you have not heard about the biopsies in 3 weeks.    SIGNATURES/CONFIDENTIALITY: You and/or your care partner have signed paperwork which will be entered into your electronic medical record.  These signatures attest to the fact that that the information above on your After Visit Summary has been reviewed and is understood.  Full responsibility of the confidentiality of this discharge information lies with you and/or your care-partner.

## 2024-08-24 NOTE — Op Note (Signed)
 [facility] Patient Name: Kristen Schwartz Procedure Date: 08/24/2024 7:09 AM MRN: [id number] Endoscopist: Glendia E. Stacia , MD, [id number] Age: 53 Referring MD:  Date of Birth: 1970-09-29 Gender: Female Account #: [id number] Procedure:                Colonoscopy Indications:              Surveillance: Personal history of piecemeal removal                            of adenoma on last colonoscopy (less than 1 year                            ago); patient underwent average risk screening                            colonoscopy in March 2025, noted to have focal                            erythema in the descending colon. This was biopsied                            and showed focal adenomatous changes. Medicines:                Monitored Anesthesia Care Procedure:                Pre-Anesthesia Assessment:                           - Prior to the procedure, a History and Physical                            was performed, and patient medications and                            allergies were reviewed. The patient's tolerance of                            previous anesthesia was also reviewed. The risks                            and benefits of the procedure and the sedation                            options and risks were discussed with the patient.                            All questions were answered, and informed consent                            was obtained. Prior Anticoagulants: The patient has                            taken no anticoagulant or antiplatelet agents. ASA  Grade Assessment: II - A patient with mild systemic                            disease. After reviewing the risks and benefits,                            the patient was deemed in satisfactory condition to                            undergo the procedure.                           After obtaining informed consent, the colonoscope                            was passed under  direct vision. Throughout the                            procedure, the patient's blood pressure, pulse, and                            oxygen saturations were monitored continuously. The                            CF [id number] #[id number] was introduced through the anus                            and advanced to the the cecum, identified by                            appendiceal orifice and ileocecal valve. The                            colonoscopy was performed without difficulty. The                            patient tolerated the procedure well. The quality                            of the bowel preparation was excellent. The                            ileocecal valve, appendiceal orifice, and rectum                            were photographed. The bowel preparation used was                            SUPREP via split dose instruction. Scope In: 8:09:07 AM Scope Out: 8:27:44 AM Scope Withdrawal Time: 0 hours 13 minutes 40 seconds  Total Procedure Duration: 0 hours 18 minutes 37 seconds  Findings:                 The perianal and digital rectal examinations were  normal. Pertinent negatives include normal                            sphincter tone and no palpable rectal lesions.                           A 2 mm polyp was found in the sigmoid colon. The                            polyp was sessile. The polyp was removed with a                            cold snare. Resection and retrieval were complete.                            Estimated blood loss was minimal.                           A 4 mm polyp was found in the proximal rectum. The                            polyp was sessile. The polyp was removed with a                            cold snare. Resection and retrieval were complete.                            Estimated blood loss was minimal.                           Multiple medium-mouthed and small-mouthed                            diverticula were found  in the sigmoid colon,                            descending colon and ascending colon. There was                            narrowing of the colon in association with the                            diverticular opening.                           The exam was otherwise normal throughout the                            examined colon.                           The retroflexed view of the distal rectum and anal  verge was normal and showed no anal or rectal                            abnormalities. Complications:            No immediate complications. Estimated Blood Loss:     Estimated blood loss was minimal. Impression:               - One 2 mm polyp in the sigmoid colon, removed with                            a cold snare. Resected and retrieved.                           - One 4 mm polyp in the proximal rectum, removed                            with a cold snare. Resected and retrieved.                           - Moderate diverticulosis in the sigmoid colon, in                            the descending colon and in the ascending colon.                            There was narrowing of the colon in association                            with the diverticular opening.                           - The distal rectum and anal verge are normal on                            retroflexion view. Recommendation:           - Patient has a contact number available for                            emergencies. The signs and symptoms of potential                            delayed complications were discussed with the                            patient. Return to normal activities tomorrow.                            Written discharge instructions were provided to the                            patient.                           -  Resume previous diet.                           - Continue present medications.                           - Await pathology results.                            - Repeat colonoscopy in 5 years for surveillance. Chalmers Iddings E. Stacia, MD 08/24/2024 8:37:49 AM This report has been signed electronically.

## 2024-08-24 NOTE — Progress Notes (Signed)
 Pt's states no medical or surgical changes since previsit or office visit.

## 2024-08-27 ENCOUNTER — Telehealth: Payer: Self-pay

## 2024-08-27 NOTE — Telephone Encounter (Signed)
" °  Follow up Call-     08/24/2024    7:17 AM 11/25/2023   10:14 AM  Call back number  Post procedure Call Back phone  # [phone]  Permission to leave phone message Yes Yes     Patient questions:  Do you have a fever, pain , or abdominal swelling? No. Pain Score  0 *  Have you tolerated food without any problems? Yes.    Have you been able to return to your normal activities? Yes.    Do you have any questions about your discharge instructions: Diet   No. Medications  No. Follow up visit  No.  Do you have questions or concerns about your Care? No.  Actions: * If pain score is 4 or above: No action needed, pain <4.   "

## 2024-08-28 LAB — SURGICAL PATHOLOGY

## 2024-08-31 ENCOUNTER — Ambulatory Visit: Payer: Self-pay | Admitting: Gastroenterology

## 2024-08-31 ENCOUNTER — Telehealth: Payer: Self-pay | Admitting: Family Medicine

## 2024-08-31 LAB — GENECONNECT MOLECULAR SCREEN: Genetic Analysis Overall Interpretation: NEGATIVE

## 2024-08-31 MED ORDER — OSELTAMIVIR PHOSPHATE 75 MG PO CAPS: 75.0000 mg | ORAL_CAPSULE | Freq: Every day | ORAL | 0 refills | Status: DC

## 2024-08-31 NOTE — Progress Notes (Signed)
 Kristen Schwartz,  Neither polyp removed from your colon was precancerous.  However, given the precancerous changes noted on the biopsies from your colonoscopy in March, I recommend closer surveillance with a repeat colonoscopy in 5 years, instead of 10.

## 2024-08-31 NOTE — Telephone Encounter (Signed)
 Received message from pt that daughter tested positive for tamiflu .  Daughter is improving. Pt would like tamiflu  preventatively which is reasonable (cares for 55yo father).

## 2024-09-12 ENCOUNTER — Telehealth: Payer: Self-pay | Admitting: Cardiovascular Disease

## 2024-09-12 NOTE — Telephone Encounter (Signed)
 Pt would like a c/b regarding if she can be prescribed a medication that will lower her HR but not elevate her BP, please advise.

## 2024-09-13 NOTE — Telephone Encounter (Signed)
 Called patient. She states she with originally on metoprolol  12.5 for tachycardia with associated tremors and it helped a ton, and her tremors have decreased tremendously. She is not on the metoprolol  anymore because it was dropping her blood pressure too low and causing sleepiness and general fatigue. She wants to know if she can be prescribed something for the tachycardia bouts during the day that can help that without lowering her blood pressure. She states the tremors are rarely a factor anymore but she still has a high heart rate even just walking around in the house she states it can get 110-120's.   Please advise.

## 2024-09-14 NOTE — Telephone Encounter (Signed)
 Called and spoke with patient. Notified her of the following from Dr. Gollan.  I do not have medications that can slow her down and help tremor that do not drop blood pressure slightly Dose of metoprolol  is the lowest unless she cuts a 12.5 in 1/2 Important she stay hydrated to keep blood pressure up Can she check some blood pressures for us  Thx TGollan    Patient verbalizes understanding. Patient will monitor blood pressure and heart rate and send them in.

## 2024-09-25 ENCOUNTER — Ambulatory Visit: Payer: Self-pay

## 2024-09-25 NOTE — Telephone Encounter (Signed)
 FYI Only or Action Required?: Action required by provider: request for appointment.  Patient was last seen in primary care on 07/10/2024 by Rilla Baller, MD.  Called Nurse Triage reporting Cough.  Symptoms began several days ago.  Interventions attempted: OTC medications:  SABRA  Symptoms are: unchanged.  Triage Disposition: See Physician Within 24 Hours  Patient/caregiver understands and will follow disposition?: Yes  Reason for Disposition  [1] Continuous (nonstop) coughing interferes with work or school AND [2] no improvement using cough treatment per Care Advice  Answer Assessment - Initial Assessment Questions Pt called in for daughter first, has same sx that began last Thursday. Wanting to make sure not PNA or bronchitis. Daughter has appt with Mliss Spray, NP 09/26/24 120, pt asking if she can be seen at same time.   1. ONSET: When did the cough begin?      Last Thursday 2. SEVERITY: How bad is the cough today?      Mild at first but got worse quickly 3. SPUTUM: Describe the color of your sputum (e.g., none, dry cough; clear, white, yellow, green)     Clear so far  5. DIFFICULTY BREATHING: Are you having difficulty breathing? If Yes, ask: How bad is it? (e.g., mild, moderate, severe)      No 6. FEVER: Do you have a fever? If Yes, ask: What is your temperature, how was it measured, and when did it start?     Low grade at night 10. OTHER SYMPTOMS: Do you have any other symptoms? (e.g., runny nose, wheezing, chest pain)       Lost smell at first  Protocols used: Cough - Acute Productive-A-AH

## 2024-09-25 NOTE — Telephone Encounter (Signed)
 Called patient to advise no there openings at that office with provider. She requested that Mliss Spray, NP just listen to here as well. It shouldn't take that long just to listen to my lungs. I advised patient that it would require an appointment time that is not available. I offered patient several alternatives that she could be seen at other offices including ours but she declined. Advised if she changes her mind to reach out to our office and we will be happy to help.

## 2024-09-26 NOTE — Telephone Encounter (Signed)
 Noted.

## 2024-09-28 ENCOUNTER — Ambulatory Visit (INDEPENDENT_AMBULATORY_CARE_PROVIDER_SITE_OTHER): Admitting: Nurse Practitioner

## 2024-09-28 VITALS — BP 100/68 | HR 87 | Temp 98.3°F | Ht 65.0 in | Wt 147.0 lb

## 2024-09-28 DIAGNOSIS — J22 Unspecified acute lower respiratory infection: Secondary | ICD-10-CM

## 2024-09-28 DIAGNOSIS — R051 Acute cough: Secondary | ICD-10-CM | POA: Diagnosis not present

## 2024-09-28 DIAGNOSIS — H6990 Unspecified Eustachian tube disorder, unspecified ear: Secondary | ICD-10-CM | POA: Diagnosis not present

## 2024-09-28 MED ORDER — FLUTICASONE PROPIONATE 50 MCG/ACT NA SUSP: 2.0000 | Freq: Every day | NASAL | 0 refills | Status: AC

## 2024-09-28 MED ORDER — BENZONATATE 200 MG PO CAPS: 200.0000 mg | ORAL_CAPSULE | Freq: Three times a day (TID) | ORAL | 0 refills | Status: AC | PRN

## 2024-09-28 MED ORDER — AMOXICILLIN-POT CLAVULANATE 875-125 MG PO TABS: 1.0000 | ORAL_TABLET | Freq: Two times a day (BID) | ORAL | 0 refills | Status: DC

## 2024-09-28 NOTE — Patient Instructions (Signed)
 Nice to see you today  Stay hydrated You can take ibuprofen or tylenol as directed over the counter Follow up if you do not improve

## 2024-09-28 NOTE — Progress Notes (Signed)
 "  Established Patient Office Visit  Subjective   Patient ID: Kristen Schwartz, female    DOB: [birth year]  Age: 54 y.o. MRN: [id number]  Chief Complaint  Patient presents with   Nasal Congestion    Pt tested for flu and covid and was negative. Symptoms started last Thursday. Pt complains of URI and lightheadedness, and congestion. Pt has taken nyquil/dayquil.     HPI   With a history of Headahce, GERD, neuropathy  Flu vaccine:  not up to date Covid Vaccine:  not up to date  Discussed the use of AI scribe software for clinical note transcription with the patient, who gave verbal consent to proceed.  History of Present Illness Kristen Schwartz is a 54 year old female who presents with upper respiratory symptoms and lightheadedness.  She has been experiencing symptoms for over a week, beginning last Thursday, including fatigue, lightheadedness, and mild headaches, which she attributes to sinus issues. The fatigue and lightheadedness are particularly prominent, with lightheadedness occurring especially when sitting still. She experiences low-grade fevers at night, with episodes of feeling hot and mild sweating over the past three to four days.  Her nasal congestion initially presented with clear mucus, which has since turned green. She reports slight improvement in head congestion, allowing for better breathing. She experiences ear popping and fullness, especially when coughing, with pain radiating up the side of her neck and into her ear. She has not taken ibuprofen today but finds it helpful for symptom relief.  No dizziness, significant body aches, shortness of breath, nausea, vomiting, or diarrhea, though she feels slightly nauseated. She has a mild headache, which she believes is related to sinus congestion, particularly on the side that was more congested.  Her current medications include magnesium , multivitamins, and a probiotic, though she is not currently taking the probiotic or gabapentin .  She was previously prescribed Tamiflu  but did not take it as she did not contract the flu. She is allergic to codeine.  Her daughter works in a daycare and frequently brings home illnesses.    Review of Systems  Constitutional:  Positive for malaise/fatigue. Negative for chills and fever (subjective).  HENT:  Positive for congestion, ear pain (popping) and sinus pain. Negative for sore throat.   Respiratory:  Positive for cough. Negative for sputum production and shortness of breath.   Gastrointestinal:  Positive for nausea. Negative for abdominal pain, diarrhea and vomiting.  Musculoskeletal:  Negative for myalgias.  Neurological:  Positive for dizziness (light headed) and headaches.      Objective:     BP 100/68   Pulse 87   Temp 98.3 F (36.8 C) (Oral)   Ht 5' 5 (1.651 m)   Wt 147 lb (66.7 kg)   SpO2 97%   BMI 24.46 kg/m  BP Readings from Last 3 Encounters:  09/28/24 100/68  08/24/24 117/68  08/08/24 110/72   Wt Readings from Last 3 Encounters:  09/28/24 147 lb (66.7 kg)  08/24/24 143 lb (64.9 kg)  08/08/24 143 lb (64.9 kg)   SpO2 Readings from Last 3 Encounters:  09/28/24 97%  08/24/24 98%  07/10/24 99%      Physical Exam Vitals and nursing note reviewed.  Constitutional:      Appearance: Normal appearance.  HENT:     Right Ear: Ear canal and external ear normal. There is impacted cerumen.     Left Ear: Ear canal and external ear normal. There is no impacted cerumen.     Nose:  Right Sinus: No maxillary sinus tenderness or frontal sinus tenderness.     Left Sinus: No maxillary sinus tenderness or frontal sinus tenderness.     Mouth/Throat:     Mouth: Mucous membranes are moist.     Pharynx: Oropharynx is clear.  Neck:     Vascular: No carotid bruit.  Cardiovascular:     Rate and Rhythm: Normal rate and regular rhythm.     Heart sounds: Normal heart sounds.  Pulmonary:     Effort: Pulmonary effort is normal.     Breath sounds: Normal breath  sounds.  Lymphadenopathy:     Cervical: No cervical adenopathy.  Neurological:     Mental Status: She is alert.      No results found for any visits on 09/28/24.    The ASCVD Risk score (Arnett DK, et al., 2019) failed to calculate for the following reasons:   Cannot find a previous HDL lab   Cannot find a previous total cholesterol lab   * - Cholesterol units were assumed    Assessment & Plan:   Problem List Items Addressed This Visit   None Visit Diagnoses       Lower respiratory infection    -  Primary   Relevant Medications   amoxicillin -clavulanate (AUGMENTIN ) 875-125 MG tablet     Dysfunction of Eustachian tube, unspecified laterality       Relevant Medications   fluticasone (FLONASE) 50 MCG/ACT nasal spray     Acute cough       Relevant Medications   benzonatate (TESSALON) 200 MG capsule      Assessment and Plan Assessment & Plan Acute upper respiratory infection with eustachian tube dysfunction Symptoms include low-grade fever, night sweats, and green nasal discharge. Eustachian tube dysfunction likely due to fluid and wax buildup. Differential includes possible lower respiratory infection. Lightheadedness may be related to ear issues, dehydration, or sinus involvement. - Prescribed Augmentin  for one week. - Recommended Delsym or Tesla Pearls for cough, up to three times a day as needed. - Advised increased fluid intake. - Prescribed Flonase nasal spray, two sprays each nostril daily for one week. - Discussed potential need for earwax removal if symptoms do not improve. -offered to do basic blood work to check hydration status, patient politely declined  Return if symptoms worsen or fail to improve.    Adina Crandall, NP  "

## 2024-10-03 ENCOUNTER — Ambulatory Visit: Payer: Self-pay

## 2024-10-03 NOTE — Telephone Encounter (Signed)
 Will see patient then Agree with ER and UC precautions

## 2024-10-03 NOTE — Telephone Encounter (Signed)
 FYI Only or Action Required?: FYI only for provider: appointment scheduled on 1/29.  Patient was last seen in primary care on 09/28/2024 by Wendee Lynwood HERO, NP.  Called Nurse Triage reporting Cough.  Symptoms began 2 weeks ago.  Interventions attempted: OTC medications: mucinex, antibiotic.  Symptoms are: gradually worsening.  Triage Disposition: See Physician Within 24 Hours  Patient/caregiver understands and will follow disposition?: Yes  Reason for Triage: severe cough, mucus in nose, rattling in the chest when coughing and breathing- taking Mucinex and OTC medication- started two weeks ago Seen pcp was prescribed a Z pack and antibiotics   Reason for Disposition  SEVERE coughing spells (e.g., whooping sound after coughing, vomiting after coughing)  Protocols used: Cough - Acute Productive-A-AH

## 2024-10-04 ENCOUNTER — Ambulatory Visit: Admitting: Family Medicine

## 2024-10-04 ENCOUNTER — Encounter: Payer: Self-pay | Admitting: Family Medicine

## 2024-10-04 VITALS — BP 130/70 | HR 91 | Temp 97.7°F | Ht 65.0 in | Wt 149.0 lb

## 2024-10-04 DIAGNOSIS — H6121 Impacted cerumen, right ear: Secondary | ICD-10-CM | POA: Diagnosis not present

## 2024-10-04 DIAGNOSIS — J209 Acute bronchitis, unspecified: Secondary | ICD-10-CM | POA: Diagnosis not present

## 2024-10-04 MED ORDER — AZITHROMYCIN 250 MG PO TABS: ORAL_TABLET | ORAL | 0 refills | Status: AC

## 2024-10-04 MED ORDER — PREDNISONE 10 MG PO TABS: ORAL_TABLET | ORAL | 0 refills | Status: AC

## 2024-10-04 NOTE — Patient Instructions (Signed)
 Drink fluids and rest  mucinex DM is good for cough and congestion  Nasal saline for congestion as needed  Continue flonase   Tylenol for fever or pain or headache  Please alert us  if symptoms worsen (if severe or short of breath please go to the ER)     Take zithromax  as directed  Take prednisone  for lung/sinus inflammation    Update if not starting to improve in a week or if worsening

## 2024-10-04 NOTE — Assessment & Plan Note (Signed)
 This continues, unsure if plays a role in intermittent dizziness Encouraged when able to use cerumen product and then follow up for irrigation

## 2024-10-04 NOTE — Assessment & Plan Note (Signed)
 More than 2 wk into uri  Treated on 1/23 with augmentin  for sinus and potentilal ear infection  Nasal/ear symptoms are improved but cough worsened (productive) , no fever or shortness of breath  Reassuring exam with rhonchi / consistent with bronchitis Will cover with zithromax  in light of length of illness and productive cough Prednisone  for bronchitis /reviewed side effects  Disc symptomatic care - see instructions on AVS Update if not starting to improve in a week or if worsening   Call back and Er precautions noted in detail today    Meds ordered this encounter  Medications   azithromycin  (ZITHROMAX ) 250 MG tablet    Sig: Take 2 tablets on day 1, then 1 tablet daily on days 2 through 5    Dispense:  6 tablet    Refill:  0   predniSONE  (DELTASONE ) 10 MG tablet    Sig: Take 3 pills once daily by mouth for 3 days, then 2 pills once daily for 3 days, then 1 pill once daily for 3 days and then stop    Dispense:  18 tablet    Refill:  0

## 2024-10-04 NOTE — Progress Notes (Signed)
 "  Subjective:    Patient ID: Kristen Schwartz, female    DOB: 03-14-1971, 54 y.o.   MRN: [id number]  HPI  Wt Readings from Last 3 Encounters:  10/04/24 149 lb (67.6 kg)  09/28/24 147 lb (66.7 kg)  08/24/24 143 lb (64.9 kg)   24.79 kg/m  Vitals:   10/04/24 0757  BP: 130/70  Pulse: 91  Temp: 97.7 F (36.5 C)  SpO2: 98%     54 yo pt of Dr KANDICE presents with c/o Duana symptoms   Saw NP Cable on 1/23 Was prescription augmentin  and flonase  and tessalon  for uri with ETD - symptoms over a week/purulent nasal mucous   Overall a little improved  Tuesday was her best day- then cough wosened on Wednesday  Rattling in chest  Has coughed hard enough to almost vomit  Is hard for her to get mucous out   Nasal mucous is more clear than it was   Sense of smell tends to come and go / today can smell a little   Cough is worst symptom  Ear pain is better    Still a little light headed    Over the counter  Mucinex (? D ) -husband's  Tried alka selzer cold    Patient Active Problem List   Diagnosis Date Noted   Acute bronchitis 10/04/2024   Neuropathy 07/10/2024   Muscle tightness 07/10/2024   Cerumen impaction 07/10/2024   Palpitations 05/29/2024   Right ankle pain 10/18/2023   Fracture of radial head, right, closed 08/01/2023   Constipation 08/01/2023   Pain of right middle finger 08/01/2023   Migraine headache 09/17/2010   GERD 09/17/2010   Disturbance, sensation, skin 09/17/2010   Right sided abdominal pain 01/24/2007   Past Medical History:  Diagnosis Date   Allergy    codiene   GERD (gastroesophageal reflux disease)    H/O: pneumonia    Migraines    Recurrent UTI    Uterine fibroid    Past Surgical History:  Procedure Laterality Date   CHOLECYSTECTOMY  2011   GALLBLADDER SURGERY     UPPER GASTROINTESTINAL ENDOSCOPY     WISDOM TOOTH EXTRACTION     Social History[1] Family History  Problem Relation Age of Onset   Cancer Mother        Multiple myeloma    Mitral valve prolapse Mother    Heart disease Mother    Colon polyps Father    Colon cancer Paternal Aunt    Arthritis Maternal Grandfather    Heart disease Maternal Grandfather    Heart disease Maternal Grandmother    Cancer Paternal Grandmother    Early death Paternal Aunt    Cancer Maternal Uncle    Cancer Paternal Uncle    Cancer Paternal Aunt    Diabetes Paternal Aunt    Cancer Paternal Aunt    Diabetes Paternal Aunt    Esophageal cancer Neg Hx    Rectal cancer Neg Hx    Stomach cancer Neg Hx    Breast cancer Neg Hx    Allergies[2] Medications Ordered Prior to Encounter[3]  Review of Systems  Constitutional:  Positive for fatigue. Negative for appetite change and fever.  HENT:  Positive for congestion and postnasal drip. Negative for ear discharge, ear pain, sore throat, trouble swallowing and voice change.   Eyes:  Negative for pain and discharge.  Respiratory:  Positive for cough. Negative for shortness of breath, wheezing and stridor.   Cardiovascular:  Negative for chest pain.  Gastrointestinal:  Negative for diarrhea, nausea and vomiting.  Genitourinary:  Negative for frequency, hematuria and urgency.  Musculoskeletal:  Negative for arthralgias and myalgias.  Skin:  Negative for rash.  Neurological:  Negative for dizziness, weakness, light-headedness and headaches.  Psychiatric/Behavioral:  Negative for confusion and dysphoric mood.        Objective:   Physical Exam Constitutional:      General: She is not in acute distress.    Appearance: Normal appearance. She is well-developed and normal weight. She is not ill-appearing, toxic-appearing or diaphoretic.  HENT:     Head: Normocephalic and atraumatic.     Comments: Nares are injected and congested    No facial tenderness    Right Ear: External ear normal. There is impacted cerumen.     Left Ear: Tympanic membrane, ear canal and external ear normal.     Nose: Congestion and rhinorrhea present.     Comments:  Mild congestion Clear rhinorrhea     Mouth/Throat:     Mouth: Mucous membranes are moist.     Pharynx: Oropharynx is clear. No oropharyngeal exudate or posterior oropharyngeal erythema.     Comments: Clear pnd  Eyes:     General:        Right eye: No discharge.        Left eye: No discharge.     Conjunctiva/sclera: Conjunctivae normal.     Pupils: Pupils are equal, round, and reactive to light.  Cardiovascular:     Rate and Rhythm: Normal rate.     Heart sounds: Normal heart sounds.  Pulmonary:     Effort: Pulmonary effort is normal. No respiratory distress.     Breath sounds: No stridor. Rhonchi present. No wheezing or rales.     Comments: Upper airway sounds Wet cough   Scattered rhonchi Good air exch No wheezing  Chest:     Chest wall: No tenderness.  Musculoskeletal:     Cervical back: Normal range of motion and neck supple.  Lymphadenopathy:     Cervical: No cervical adenopathy.  Skin:    General: Skin is warm and dry.     Capillary Refill: Capillary refill takes less than 2 seconds.     Findings: No rash.  Neurological:     Mental Status: She is alert.     Cranial Nerves: No cranial nerve deficit.  Psychiatric:        Mood and Affect: Mood normal.           Assessment & Plan:   Assessment & Plan Acute bronchitis, unspecified organism More than 2 wk into uri  Treated on 1/23 with augmentin  for sinus and potentilal ear infection  Nasal/ear symptoms are improved but cough worsened (productive) , no fever or shortness of breath  Reassuring exam with rhonchi / consistent with bronchitis Will cover with zithromax  in light of length of illness and productive cough Prednisone  for bronchitis /reviewed side effects  Disc symptomatic care - see instructions on AVS Update if not starting to improve in a week or if worsening   Call back and Er precautions noted in detail today    Meds ordered this encounter  Medications   azithromycin  (ZITHROMAX ) 250 MG tablet     Sig: Take 2 tablets on day 1, then 1 tablet daily on days 2 through 5    Dispense:  6 tablet    Refill:  0   predniSONE  (DELTASONE ) 10 MG tablet    Sig: Take 3 pills once daily by  mouth for 3 days, then 2 pills once daily for 3 days, then 1 pill once daily for 3 days and then stop    Dispense:  18 tablet    Refill:  0       Impacted cerumen of right ear This continues, unsure if plays a role in intermittent dizziness Encouraged when able to use cerumen product and then follow up for irrigation          [1]  Social History Tobacco Use   Smoking status: Never   Smokeless tobacco: Never  Vaping Use   Vaping status: Never Used  Substance Use Topics   Alcohol use: No   Drug use: Never  [2]  Allergies Allergen Reactions   Codeine Sulfate Nausea Only  [3]  Current Outpatient Medications on File Prior to Visit  Medication Sig Dispense Refill   benzonatate  (TESSALON ) 200 MG capsule Take 1 capsule (200 mg total) by mouth 3 (three) times daily as needed for cough. 21 capsule 0   fluticasone  (FLONASE ) 50 MCG/ACT nasal spray Place 2 sprays into both nostrils daily. 16 g 0   gabapentin  (NEURONTIN ) 100 MG capsule Take 100 mg twice daily for one week, then increase to 200 mg twice daily     Magnesium  250 MG CAPS Take 1 capsule by mouth daily.     Multiple Vitamin (MULTIVITAMIN WITH MINERALS) TABS tablet Take 1 tablet by mouth daily.     Multiple Vitamins-Minerals (OCUVITE PO) Take by mouth daily.     OVER THE COUNTER MEDICATION Take 250 mcg by mouth daily.     Probiotic Product (ALIGN PO) Take by mouth daily.     No current facility-administered medications on file prior to visit.   "

## 2024-11-19 ENCOUNTER — Ambulatory Visit: Admitting: Diagnostic Neuroimaging
